# Patient Record
Sex: Female | Born: 1995 | Race: Black or African American | Hispanic: No | Marital: Single | Smoking: Never smoker
Health system: Southern US, Community
[De-identification: ages and names within clinical notes are randomized; demographics above are authoritative.]

## PROBLEM LIST (undated history)

## (undated) ENCOUNTER — Inpatient Hospital Stay (HOSPITAL_COMMUNITY): Payer: Self-pay

## (undated) DIAGNOSIS — F419 Anxiety disorder, unspecified: Secondary | ICD-10-CM

## (undated) DIAGNOSIS — A549 Gonococcal infection, unspecified: Secondary | ICD-10-CM

## (undated) DIAGNOSIS — J45909 Unspecified asthma, uncomplicated: Secondary | ICD-10-CM

## (undated) DIAGNOSIS — M419 Scoliosis, unspecified: Secondary | ICD-10-CM

## (undated) DIAGNOSIS — D649 Anemia, unspecified: Secondary | ICD-10-CM

## (undated) DIAGNOSIS — A749 Chlamydial infection, unspecified: Secondary | ICD-10-CM

## (undated) HISTORY — DX: Anxiety disorder, unspecified: F41.9

## (undated) HISTORY — PX: NOSE SURGERY: SHX723

## (undated) HISTORY — PX: TONSILLECTOMY: SUR1361

## (undated) HISTORY — DX: Anemia, unspecified: D64.9

---
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
## 2017-01-07 ENCOUNTER — Encounter (HOSPITAL_COMMUNITY): Payer: Self-pay

## 2017-01-07 ENCOUNTER — Inpatient Hospital Stay (HOSPITAL_COMMUNITY)
Admission: AD | Admit: 2017-01-07 | Discharge: 2017-01-07 | Disposition: A | Discharge status: Home / Self Care | Payer: Medicaid Other | Source: Ambulatory Visit | Attending: Obstetrics & Gynecology | Admitting: Obstetrics & Gynecology

## 2017-01-07 DIAGNOSIS — O479 False labor, unspecified: Secondary | ICD-10-CM

## 2017-01-07 DIAGNOSIS — Z3A33 33 weeks gestation of pregnancy: Secondary | ICD-10-CM | POA: Insufficient documentation

## 2017-01-07 DIAGNOSIS — O4700 False labor before 37 completed weeks of gestation, unspecified trimester: Secondary | ICD-10-CM

## 2017-01-07 DIAGNOSIS — R109 Unspecified abdominal pain: Secondary | ICD-10-CM | POA: Diagnosis present

## 2017-01-07 LAB — URINALYSIS, ROUTINE W REFLEX MICROSCOPIC
BILIRUBIN URINE: NEGATIVE
Glucose, UA: NEGATIVE mg/dL
HGB URINE DIPSTICK: NEGATIVE
KETONES UR: NEGATIVE mg/dL
NITRITE: NEGATIVE
PROTEIN: NEGATIVE mg/dL
Specific Gravity, Urine: 1.02 (ref 1.005–1.030)
pH: 7 (ref 5.0–8.0)

## 2017-01-07 LAB — WET PREP, GENITAL
Clue Cells Wet Prep HPF POC: NONE SEEN
Sperm: NONE SEEN
Trich, Wet Prep: NONE SEEN
YEAST WET PREP: NONE SEEN

## 2017-01-07 LAB — FETAL FIBRONECTIN: Fetal Fibronectin: NEGATIVE

## 2017-01-07 MED ORDER — NIFEDIPINE 10 MG PO CAPS
10.0000 mg | ORAL_CAPSULE | ORAL | Status: DC | PRN
  Administered 2017-01-07: 10 mg via ORAL
  Filled 2017-01-07: qty 1

## 2017-01-07 NOTE — MAU Provider Note (Signed)
History     CSN: [id number]  Arrival date and time: 01/07/17 1012   First Provider Initiated Contact with Patient 01/07/17 1035      Chief Complaint  Patient presents with  . Abdominal Pain   Patient is a 21 year old G1 P0 at 33 weeks and 3 days who presents to the [facility] via EMS for contractions that started this morning when she woke up. She reports intermittent pressure happening about every 5 minutes. She denies any sex or anything in the vagina and the past 24 hours. She states she's had no vaginal bleeding. She reports regular fetal movement. The contractions are not sharp nor do they feel like tightening in the belly they just feel like increases and decreases in pressure. She has had no problems with this pregnancy so far. She denies any vaginal discharge.    OB History    Gravida Para Term Preterm AB Living   1             SAB TAB Ectopic Multiple Live Births                  Past Medical History:  Diagnosis Date  . Asthma   . Scoliosis     Past Surgical History:  Procedure Laterality Date  . NOSE SURGERY    . TONSILLECTOMY      History reviewed. No pertinent family history.  Social History  Substance Use Topics  . Smoking status: Never Smoker  . Smokeless tobacco: Never Used  . Alcohol use No    Allergies: No Known Allergies  Prescriptions Prior to Admission  Medication Sig Dispense Refill Last Dose  . Prenatal Vit-Fe Fumarate-FA (PRENATAL MULTIVITAMIN) TABS tablet Take 1 tablet by mouth daily at 12 noon.   01/06/2017 at Unknown time    Review of Systems  Constitutional: Negative for chills and fever.  HENT: Negative for congestion and rhinorrhea.   Respiratory: Negative for apnea and shortness of breath.   Cardiovascular: Negative for chest pain and palpitations.  Gastrointestinal: Negative for abdominal distention, constipation, diarrhea, nausea and vomiting.  Genitourinary: Negative for difficulty urinating, dysuria and frequency.  Neurological:  Negative for dizziness and weakness.   Physical Exam   Blood pressure 112/62, pulse 83, temperature 97.6 F (36.4 C), resp. rate 18, last menstrual period 05/18/2016.  Physical Exam  Vitals reviewed. Constitutional: She is oriented to person, place, and time. She appears well-developed and well-nourished.  Cardiovascular: Normal rate, regular rhythm and normal heart sounds.   Respiratory: Effort normal and breath sounds normal. No respiratory distress.  GI: Soft. Bowel sounds are normal. She exhibits no distension. There is no tenderness. There is no rebound and no guarding.  Genitourinary:  Genitourinary Comments: Significant whitish thick vaginal discharge, healthy pink vaginal mucosa, cervix mildly irritated, on digital exam is 1/50 and -2  Neurological: She is alert and oriented to person, place, and time.  Skin: Skin is warm and dry.  Psychiatric: She has a normal mood and affect. Her behavior is normal.    MAU Course  Procedures  MDM In the MA U patient underwent the following interventions -Urinalysis which although grossly contaminated was suspicious for UTI, sent for culture -Fetal Fibronectin which was negative -Wet prep which was negative -Gonorrhea and chlamydia which is pending -NST was performed and revealed a approximately 150s moderate variability reactive for gestational age mild uterine irritability was noted but no distinct contractions. -Cervix was checked and was found to be 1/50 and -2 given that we  are picking up contractions on the monitor and she reported improved pain with Procardia cervix was not rechecked.  She did receive 10 mg of by mouth Procardia with significant improvement in her symptoms. Patient is now comfortable and ready to be discharged home.  Assessment and Plan  Preterm contractions: Improved with Procardia no identifiable cause at this time will continue to monitor follow-up in the office as scheduled. Given precautions to return if  contractions returned. Continue by mouth hydration. Pending results.  Ernestina Penna 01/07/2017, 10:47 AM

## 2017-01-07 NOTE — MAU Note (Signed)
Arrived by EMS. Pt having abdominal pain that started an hour ago. No bleeding or LOF.

## 2017-01-07 NOTE — Discharge Instructions (Signed)
Preventing Preterm Birth °Preterm birth is when your baby is delivered between 20 weeks and 37 weeks of pregnancy. A full-term pregnancy lasts for at least 37 weeks. Preterm birth can be dangerous for your baby because the last few weeks of pregnancy are an important time for your baby's brain and lungs to grow. Many things can cause a baby to be born early. Sometimes the cause is not known. There are certain factors that make you more likely to experience preterm birth, such as: °· Having a previous baby born preterm. °· Being pregnant with twins or other multiples. °· Having had fertility treatment. °· Being overweight or underweight at the start of your pregnancy. °· Having any of the following during pregnancy: °¨ An infection, including a urinary tract infection (UTI) or an STI (sexually transmitted infection). °¨ High blood pressure. °¨ Diabetes. °¨ Vaginal bleeding. °· Being age 35 or older. °· Being age 18 or younger. °· Getting pregnant within 6 months of a previous pregnancy. °· Suffering extreme stress or physical or emotional abuse during pregnancy. °· Standing for long periods of time during pregnancy, such as working at a job that requires standing. °What are the risks? °The most serious risk of preterm birth is that the baby may not survive. This is more likely to happen if a baby is born before 34 weeks. Other risks and complications of preterm birth may include your baby having: °· Breathing problems. °· Brain damage that affects movement and coordination (cerebral palsy). °· Feeding difficulties. °· Vision or hearing problems. °· Infections or inflammation of the digestive tract (colitis). °· Developmental delays. °· Learning disabilities. °· Higher risk for diabetes, heart disease, and high blood pressure later in life. °What can I do to lower my risk? °Medical care °The most important thing you can do to lower your risk for preterm birth is to get routine medical care during pregnancy (prenatal  care). If you have a high risk of preterm birth, you may be referred to a health care provider who specializes in managing high-risk pregnancies (perinatologist). You may be given medicine to help prevent preterm birth. °Lifestyle changes °Certain lifestyle changes can also lower your risk of preterm birth: °· Wait at least 6 months after a pregnancy to become pregnant again. °· Try to plan pregnancy for when you are between 19 and 35 years old. °· Get to a healthy weight before getting pregnant. If you are overweight, work with your health care provider to safely lose weight. °· Do not use any products that contain nicotine or tobacco, such as cigarettes and e-cigarettes. If you need help quitting, ask your health care provider. °· Do not drink alcohol. °· Do not use drugs. °Where to find support: °For more support, consider: °· Talking with your health care provider. °· Talking with a therapist or substance abuse counselor, if you need help quitting. °· Working with a diet and nutrition specialist (dietitian) or a personal trainer to maintain a healthy weight. °· Joining a support group. °Where to find more information: °Learn more about preventing preterm birth from: °· Centers for Disease Control and Prevention: cdc.gov/reproductivehealth/maternalinfanthealth/pretermbirth.htm °· March of Dimes: marchofdimes.org/complications/premature-babies.aspx °· American Pregnancy Association: americanpregnancy.org/labor-and-birth/premature-labor °Contact a health care provider if: °· You have any of the following signs of preterm labor before 37 weeks: °¨ A change or increase in vaginal discharge. °¨ Fluid leaking from your vagina. °¨ Pressure or cramps in your lower abdomen. °¨ A backache that does not go away or gets worse. °¨   Regular tightening (contractions) in your lower abdomen. °Summary °· Preterm birth means having your baby during weeks 20-37 of pregnancy. °· Preterm birth may put your baby at risk for physical and  mental problems. °· Getting good prenatal care can help prevent preterm birth. °· You can lower your risk of preterm birth by making certain lifestyle changes, such as not smoking and not using alcohol. °This information is not intended to replace advice given to you by your health care provider. Make sure you discuss any questions you have with your health care provider. °Document Released: 12/24/2015 Document Revised: 07/18/2016 Document Reviewed: 07/18/2016 °Elsevier Interactive Patient Education © 2017 Elsevier Inc. ° °

## 2017-01-08 LAB — CULTURE, OB URINE

## 2017-01-20 LAB — OB RESULTS CONSOLE GBS: STREP GROUP B AG: NEGATIVE

## 2017-02-19 ENCOUNTER — Inpatient Hospital Stay (HOSPITAL_COMMUNITY)
Admission: AD | Admit: 2017-02-19 | Discharge: 2017-02-21 | DRG: 775 | Disposition: A | Discharge status: Home / Self Care | Payer: Medicaid Other | Source: Ambulatory Visit | Attending: Obstetrics & Gynecology | Admitting: Obstetrics & Gynecology

## 2017-02-19 ENCOUNTER — Encounter (HOSPITAL_COMMUNITY): Payer: Self-pay

## 2017-02-19 DIAGNOSIS — Z3493 Encounter for supervision of normal pregnancy, unspecified, third trimester: Secondary | ICD-10-CM | POA: Diagnosis present

## 2017-02-19 DIAGNOSIS — J45909 Unspecified asthma, uncomplicated: Secondary | ICD-10-CM | POA: Diagnosis present

## 2017-02-19 DIAGNOSIS — M419 Scoliosis, unspecified: Secondary | ICD-10-CM | POA: Diagnosis present

## 2017-02-19 DIAGNOSIS — Z3A39 39 weeks gestation of pregnancy: Secondary | ICD-10-CM | POA: Diagnosis not present

## 2017-02-19 DIAGNOSIS — O9952 Diseases of the respiratory system complicating childbirth: Principal | ICD-10-CM | POA: Diagnosis present

## 2017-02-19 LAB — ABO/RH: ABO/RH(D): A POS

## 2017-02-19 LAB — CBC
HCT: 35.3 % — ABNORMAL LOW (ref 36.0–46.0)
HEMOGLOBIN: 11.6 g/dL — AB (ref 12.0–15.0)
MCH: 28.2 pg (ref 26.0–34.0)
MCHC: 32.9 g/dL (ref 30.0–36.0)
MCV: 85.7 fL (ref 78.0–100.0)
PLATELETS: 233 10*3/uL (ref 150–400)
RBC: 4.12 MIL/uL (ref 3.87–5.11)
RDW: 14.8 % (ref 11.5–15.5)
WBC: 11.9 10*3/uL — ABNORMAL HIGH (ref 4.0–10.5)

## 2017-02-19 LAB — RPR: RPR Ser Ql: NONREACTIVE

## 2017-02-19 LAB — TYPE AND SCREEN
ABO/RH(D): A POS
ANTIBODY SCREEN: NEGATIVE

## 2017-02-19 MED ORDER — ONDANSETRON HCL 4 MG/2ML IJ SOLN: 4.0000 mg | INTRAMUSCULAR | Status: DC | PRN

## 2017-02-19 MED ORDER — OXYTOCIN 10 UNIT/ML IJ SOLN
INTRAMUSCULAR | Status: AC
  Administered 2017-02-19: 10 [IU]
  Filled 2017-02-19: qty 1

## 2017-02-19 MED ORDER — ONDANSETRON HCL 4 MG/2ML IJ SOLN: 4.0000 mg | Freq: Four times a day (QID) | INTRAMUSCULAR | Status: DC | PRN

## 2017-02-19 MED ORDER — WITCH HAZEL-GLYCERIN EX PADS: 1.0000 "application " | MEDICATED_PAD | CUTANEOUS | Status: DC | PRN

## 2017-02-19 MED ORDER — ACETAMINOPHEN 325 MG PO TABS: 650.0000 mg | ORAL_TABLET | ORAL | Status: DC | PRN

## 2017-02-19 MED ORDER — TETANUS-DIPHTH-ACELL PERTUSSIS 5-2.5-18.5 LF-MCG/0.5 IM SUSP: 0.5000 mL | Freq: Once | INTRAMUSCULAR | Status: DC

## 2017-02-19 MED ORDER — LIDOCAINE HCL (PF) 1 % IJ SOLN: 30.0000 mL | INTRAMUSCULAR | Status: DC | PRN

## 2017-02-19 MED ORDER — OXYCODONE HCL 5 MG PO TABS: 5.0000 mg | ORAL_TABLET | ORAL | Status: DC | PRN

## 2017-02-19 MED ORDER — LIDOCAINE HCL (PF) 1 % IJ SOLN
INTRAMUSCULAR | Status: AC
  Filled 2017-02-19: qty 30

## 2017-02-19 MED ORDER — FENTANYL CITRATE (PF) 100 MCG/2ML IJ SOLN: 50.0000 ug | INTRAMUSCULAR | Status: DC | PRN

## 2017-02-19 MED ORDER — METHYLERGONOVINE MALEATE 0.2 MG PO TABS: 0.2000 mg | ORAL_TABLET | ORAL | Status: DC | PRN

## 2017-02-19 MED ORDER — OXYTOCIN BOLUS FROM INFUSION: 500.0000 mL | Freq: Once | INTRAVENOUS | Status: DC

## 2017-02-19 MED ORDER — ZOLPIDEM TARTRATE 5 MG PO TABS: 5.0000 mg | ORAL_TABLET | Freq: Every evening | ORAL | Status: DC | PRN

## 2017-02-19 MED ORDER — BISACODYL 10 MG RE SUPP: 10.0000 mg | Freq: Every day | RECTAL | Status: DC | PRN

## 2017-02-19 MED ORDER — COCONUT OIL OIL: 1.0000 "application " | TOPICAL_OIL | Status: DC | PRN

## 2017-02-19 MED ORDER — LACTATED RINGERS IV SOLN: INTRAVENOUS | Status: DC

## 2017-02-19 MED ORDER — OXYCODONE HCL 5 MG PO TABS: 10.0000 mg | ORAL_TABLET | ORAL | Status: DC | PRN

## 2017-02-19 MED ORDER — BENZOCAINE-MENTHOL 20-0.5 % EX AERO: 1.0000 "application " | INHALATION_SPRAY | CUTANEOUS | Status: DC | PRN

## 2017-02-19 MED ORDER — OXYTOCIN 40 UNITS IN LACTATED RINGERS INFUSION - SIMPLE MED: 2.5000 [IU]/h | INTRAVENOUS | Status: DC

## 2017-02-19 MED ORDER — FERROUS SULFATE 325 (65 FE) MG PO TABS
325.0000 mg | ORAL_TABLET | Freq: Two times a day (BID) | ORAL | Status: DC
  Administered 2017-02-19 – 2017-02-21 (×5): 325 mg via ORAL
  Filled 2017-02-19 (×5): qty 1

## 2017-02-19 MED ORDER — OXYCODONE-ACETAMINOPHEN 5-325 MG PO TABS: 1.0000 | ORAL_TABLET | ORAL | Status: DC | PRN

## 2017-02-19 MED ORDER — METHYLERGONOVINE MALEATE 0.2 MG/ML IJ SOLN: 0.2000 mg | INTRAMUSCULAR | Status: DC | PRN

## 2017-02-19 MED ORDER — MEASLES, MUMPS & RUBELLA VAC ~~LOC~~ INJ: 0.5000 mL | INJECTION | Freq: Once | SUBCUTANEOUS | Status: DC

## 2017-02-19 MED ORDER — ONDANSETRON HCL 4 MG PO TABS
4.0000 mg | ORAL_TABLET | ORAL | Status: DC | PRN
Start: 2017-02-19 — End: 2017-02-21

## 2017-02-19 MED ORDER — DIBUCAINE 1 % RE OINT
1.0000 | TOPICAL_OINTMENT | RECTAL | Status: DC | PRN
Start: 2017-02-19 — End: 2017-02-21

## 2017-02-19 MED ORDER — DOCUSATE SODIUM 100 MG PO CAPS: 100.0000 mg | ORAL_CAPSULE | Freq: Two times a day (BID) | ORAL | Status: DC

## 2017-02-19 MED ORDER — FLEET ENEMA 7-19 GM/118ML RE ENEM: 1.0000 | ENEMA | Freq: Every day | RECTAL | Status: DC | PRN

## 2017-02-19 MED ORDER — PRENATAL MULTIVITAMIN CH
1.0000 | ORAL_TABLET | Freq: Every day | ORAL | Status: DC
  Administered 2017-02-20 – 2017-02-21 (×2): 1 via ORAL
  Filled 2017-02-19 (×2): qty 1

## 2017-02-19 MED ORDER — SIMETHICONE 80 MG PO CHEW
80.0000 mg | CHEWABLE_TABLET | ORAL | Status: DC | PRN
Start: 2017-02-19 — End: 2017-02-21

## 2017-02-19 MED ORDER — DOCUSATE SODIUM 100 MG PO CAPS
100.0000 mg | ORAL_CAPSULE | Freq: Two times a day (BID) | ORAL | Status: DC
  Administered 2017-02-19 – 2017-02-21 (×4): 100 mg via ORAL
  Filled 2017-02-19 (×4): qty 1

## 2017-02-19 MED ORDER — FLEET ENEMA 7-19 GM/118ML RE ENEM: 1.0000 | ENEMA | RECTAL | Status: DC | PRN

## 2017-02-19 MED ORDER — DIPHENHYDRAMINE HCL 25 MG PO CAPS: 25.0000 mg | ORAL_CAPSULE | Freq: Four times a day (QID) | ORAL | Status: DC | PRN

## 2017-02-19 MED ORDER — SOD CITRATE-CITRIC ACID 500-334 MG/5ML PO SOLN: 30.0000 mL | ORAL | Status: DC | PRN

## 2017-02-19 MED ORDER — IBUPROFEN 600 MG PO TABS
600.0000 mg | ORAL_TABLET | Freq: Four times a day (QID) | ORAL | Status: DC
  Administered 2017-02-19 – 2017-02-21 (×11): 600 mg via ORAL
  Filled 2017-02-19 (×11): qty 1

## 2017-02-19 MED ORDER — LACTATED RINGERS IV SOLN: 500.0000 mL | INTRAVENOUS | Status: DC | PRN

## 2017-02-19 MED ORDER — OXYCODONE-ACETAMINOPHEN 5-325 MG PO TABS: 2.0000 | ORAL_TABLET | ORAL | Status: DC | PRN

## 2017-02-19 NOTE — H&P (Signed)
  Makayla White is a 21 y.o. female G1P0 with IUP at [redacted]w[redacted]d presenting for contractions. She arrived via EMS c//c/+2t.   [facility]are at [facility] since 12 wks  Prenatal History/Complications:  Chl/GC 1st timester, + CHL 2/28 (no TOC but txd_)  Past Medical History: Past Medical History:  Diagnosis Date  . Asthma   . Scoliosis     Past Surgical History: Past Surgical History:  Procedure Laterality Date  . NOSE SURGERY    . TONSILLECTOMY      Obstetrical History: OB History    Gravida Para Term Preterm AB Living   1             SAB TAB Ectopic Multiple Live Births                   Social History: Social History   Social History  . Marital status: Single    Spouse name: N/A  . Number of children: N/A  . Years of education: N/A   Social History Main Topics  . Smoking status: Never Smoker  . Smokeless tobacco: Never Used  . Alcohol use No  . Drug use: No  . Sexual activity: Not on file   Other Topics Concern  . Not on file   Social History Narrative  . No narrative on file    Family History: No family history on file.  Allergies: No Known Allergies  Prescriptions Prior to Admission  Medication Sig Dispense Refill Last Dose  . Prenatal Vit-Fe Fumarate-FA (PRENATAL MULTIVITAMIN) TABS tablet Take 1 tablet by mouth daily at 12 noon.   01/06/2017 at Unknown time        Review of Systems   Constitutional: Negative for fever and chills Eyes: Negative for visual disturbances Respiratory: Negative for shortness of breath, dyspnea Cardiovascular: Negative for chest pain or palpitations  Gastrointestinal: Negative for vomiting, diarrhea and constipation.  POSITIVE for abdominal pain (contractions) Genitourinary: Negative for dysuria and urgency Musculoskeletal: Negative for back pain, joint pain, myalgias  Neurological: Negative for dizziness and headaches      Last menstrual period 05/18/2016. General appearance: alert, cooperative and mild  distress Lungs: clear to auscultation bilaterally Heart: regular rate and rhythm Abdomen: soft, non-tender; bowel sounds normal Extremities: Homans sign is negative, no sign of DVT DTR's 2+ Presentation: cephalic Fetal monitoring  Baseline: 150 bpm, Variability: Good {> 6 bpm), Accelerations: Reactive and Decelerations: Absent Uterine activity  q minutes Dilation: 10 Station: 0 Exam by:: Millner, RN    Prenatal labs: ABO, Rh:  A+ Antibody:  neg Rubella: !Error! RPR:   neg HBsAg:   neg HIV:   neg GBS:   neg 1 hr Glucola 112 Genetic screening  neg Anatomy US normal  Prenatal Transfer Tool  Maternal Diabetes: No Genetic Screening: Normal Maternal Ultrasounds/Referrals: Normal Fetal Ultrasounds or other Referrals:  None Maternal Substance Abuse:  No Significant Maternal Medications:  None Significant Maternal Lab Results: Lab values include: Other: + CHL 1st and 3rd trimester (txz'd but no TOC)     No results found for this or any previous visit (from the past 24 hour(s)).  Assessment: Makayla White is a 21 y.o. G1P0 with an IUP at [redacted]w[redacted]d presenting for active labor  Plan: #Labor: expectant management #Pain:  Per request #FWB Cat 1 #ID: GBS: neg  #MOF:  breast #MOC: pills #Circ: outpt   CRESENZO-DISHMAN,[name] 02/19/2017, 6:51 AM

## 2017-02-19 NOTE — MAU Note (Signed)
Pt. Arrived via EMS, pt. Complaint of "hurting contractions". Pt. Placed on monitor, fetal heart rate 130s, toco applied - contraction noted.  Pt. start crying and wanting to go to BR for bowel movement.  SVE C/C/0 with bloody show.  Provider notified, pt. transferred to L&D via stretcher.

## 2017-02-19 NOTE — Lactation Note (Signed)
This note was copied from a baby's chart. Lactation Consultation Note  Patient Name: Makayla White [id number] Date: 02/19/2017 Reason for consult: Initial assessment   Initial consult with first time mom of 3 hour old infant in [place]. Infant in deep sleep on mom's chest. Mom reports infant fed earlier. RN reports infant had difficulty latching due to flat nipples.  Mom reports + breast changes with pregnancy. Mom with large compressible breasts with flat nipples. Showed mom how to hand express and large gtts colostrum noted. Discussued with mom using breast shells and hand pump to assist with everting nipple before latch. Mom voiced understanding.   BF basics, positioning, colostrum, milk coming to volume, hand expression, NB nutritional needs, and supply and demand reviewed with mom. Mom reports she took a BF class. Feeding log given with instructions for use.  BF Resources handout and LC Brochure given, mom informed of IP/OP Services, BF Support Groups and LC phone #. Enc mom to call out when infant awakens for LC to assist with feeding. Mom voiced understanding.    Maternal Data Formula Feeding for Exclusion: No Has patient been taught Hand Expression?: Yes Does the patient have breastfeeding experience prior to this delivery?: No  Feeding    LATCH Score/Interventions                      Lactation Tools Discussed/Used WIC Program: Yes   Consult Status Consult Status: Follow-up Date: 02/19/17 Follow-up type: In-patient    Silas Flood Jakavion Bilodeau 02/19/2017, 10:31 AM

## 2017-02-19 NOTE — Lactation Note (Signed)
This note was copied from a baby's chart. Lactation Consultation Note  Patient Name: Makayla White [id number] Date: 02/19/2017 Reason for consult: Follow-up assessment;Difficult latch   Follow up consult with mom of 4 hour old infant at mom's request. Attempted to latch infant to left breast, infant not able to sustain latch. Right areola is thick and non compressible left nipple semi compressible. Showed mom how to apply # 20 NS, infant then latched and fed fo 20 minutes. Colostrum was in NS when infant came off. Showed mom how to hand express and colostrum easily expressible, 4 cc colostrum fed to infant via spoon, infant tolerated it well.  Enc mom to feed infant at breast STS 8-12 x in 24 hours at first feeding cues. Enc mom to use # 20 NS as needed. Enc mom to hand express and spoon feed infant EBM. Enc mom to call out for assistance as needed.   Breast shells applies post feeding. Hand pump given with instructions for use to evert nipples and cleaning. Discussed that NS is a barrier and that if mom is still using after 24 hours to begin pumping to protect milk supply. Mom without questions/concerns at this time.    Maternal Data Formula Feeding for Exclusion: No Has patient been taught Hand Expression?: Yes Does the patient have breastfeeding experience prior to this delivery?: No  Feeding Feeding Type: Breast Fed Length of feed: 15 min  LATCH Score/Interventions Latch: Repeated attempts needed to sustain latch, nipple held in mouth throughout feeding, stimulation needed to elicit sucking reflex. Intervention(s): Adjust position;Assist with latch;Breast massage;Breast compression  Audible Swallowing: Spontaneous and intermittent  Type of Nipple: Flat Intervention(s): Hand pump;Shells  Comfort (Breast/Nipple): Soft / non-tender     Hold (Positioning): Assistance needed to correctly position infant at breast and maintain latch. Intervention(s): Breastfeeding basics  reviewed;Support Pillows;Position options;Skin to skin  LATCH Score: 7  Lactation Tools Discussed/Used Tools: Nipple Shields Nipple shield size: 20 WIC Program: Yes   Consult Status Consult Status: Follow-up Date: 02/20/17 Follow-up type: In-patient    Silas Flood Calianna Kim 02/19/2017, 12:15 PM

## 2017-02-20 NOTE — Lactation Note (Signed)
This note was copied from a baby's chart. Lactation Consultation Note  Patient Name: Makayla White [id number] Date: 02/20/2017 Reason for consult: Follow-up assessment Infant is 57 hours old & seen by lactation for follow-up assessment. Baby was asleep in crib when LC entered. Mom reported that she wanted to do formula and breastfeed because BF was difficult. Mom reports that her breasts are starting to feel full. Suggested mom try pumping instead of supplementing with formula since her breasts were filling. Mom agreed. Showed mom how to use & clean DEBP. Mom has WIC so faxed referral in case mom is eligible for a pump upon discharge. Mom wanted to pump while LC in room so set mom up pumping. Mom pumped for ~10 mins and got ~61mL from her left breast and a few drops from her right (the right pump body did not have as good of suction so readjusted and it starting pumping better). Baby started showing feeding cues so suggested mom feed baby. Mom tried cross-cradle hold on left breast with nipple shield and baby latched but was not very deep and did not suckle. Suggested mom try laid-back breastfeeding and baby soon went towards mom's right nipple but was not able to sustain a latch (mom's right nipple is very flat) with or without a nipple shield. Tried to get baby to latch onto left breast since her left nipple was a little better. Baby would not latch with nipple shield but did latch without it and suckled for ~5 mins and then would not relatch. Mom suggested trying football hold on her right breast so mom did so with the nipple shield. Baby latched and suckled for ~5 mins and then came off. Milk was in shield after he came off and mom relatched him. LC left while he was still BF so unsure of total length of feeding. Discussed plan with mom- BF at least 8-12x in 24hrs and then post-pump for 10 mins and give baby back any that she pumps if baby still acting hungry.  Mom reports no questions at this  time. Encouraged mom to ask for help as needed.  Maternal Data    Feeding    LATCH Score/Interventions                      Lactation Tools Discussed/Used Pump Review: Setup, frequency, and cleaning;Milk Storage Initiated by:: Blondell Reveal, RD, IBCLC Date initiated:: 02/20/17   Consult Status Consult Status: Follow-up Date: 02/21/17 Follow-up type: In-patient    Oneal Grout 02/20/2017, 7:14 PM
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
## 2017-02-21 ENCOUNTER — Encounter (HOSPITAL_COMMUNITY): Payer: Self-pay

## 2017-02-21 MED ORDER — IBUPROFEN 600 MG PO TABS: 600.0000 mg | ORAL_TABLET | Freq: Four times a day (QID) | ORAL | 0 refills | Status: DC

## 2017-02-21 MED ORDER — NORETHINDRONE 0.35 MG PO TABS: 1.0000 | ORAL_TABLET | Freq: Every day | ORAL | 11 refills | Status: DC
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
## 2017-02-21 NOTE — Discharge Summary (Signed)
OB Discharge Summary     Patient Name: Makayla White DOB: 12-26-1995 MRN: [id number]  Date of admission: 02/19/2017 Delivering MD: Sharyon Cable   Date of discharge: 02/21/2017  Admitting diagnosis: 39 WEEKS CTX Intrauterine pregnancy: [redacted]w[redacted]d     Secondary diagnosis:  Active Problems:   Normal labor  Additional problems: none     Discharge diagnosis: Term Pregnancy Delivered                                                                                                Post partum procedures:none  Augmentation: none  Complications: None  Hospital course:  Onset of Labor With Vaginal Delivery     21 y.o. yo G1P1001 at [redacted]w[redacted]d was admitted in Active Labor on 02/19/2017. She was 10cm on admission.   Patient had an uncomplicated labor course as follows:  Membrane Rupture Time/Date: 6:46 AM ,02/19/2017   Intrapartum Procedures: Episiotomy: None [1]                                         Lacerations:  None [1]  Patient had a delivery of a Viable infant. 02/19/2017  Information for the patient's newborn:  Ketina, Mars [[id number]]  Delivery Method: Vaginal, Spontaneous Delivery (Filed from Delivery Summary)    Pateint had an uncomplicated postpartum course.  She is ambulating, tolerating a regular diet, passing flatus, and urinating well. Patient is discharged home in stable condition on 02/21/17.   Physical exam  Vitals:   02/19/17 1948 02/20/17 0551 02/20/17 1728 02/21/17 0454  BP: 123/74 116/78 107/61 119/64  Pulse: 79 83 76 72  Resp: Temp: 99.3 F (37.4 C) 98.4 F (36.9 C) 97.7 F (36.5 C) 97.4 F (36.3 C)  TempSrc:   Oral Oral   General: alert, cooperative and no distress Lochia: appropriate Uterine Fundus: firm Incision: Healing well with no significant drainage DVT Evaluation: No evidence of DVT seen on physical exam. Labs: Lab Results  Component Value Date   WBC 11.9 (H) 02/19/2017   HGB 11.6 (L) 02/19/2017   HCT 35.3  (L) 02/19/2017   MCV 85.7 02/19/2017   PLT 233 02/19/2017   No flowsheet data found.  Discharge instruction: per After Visit Summary and "Baby and Me Booklet".  After visit meds:  Allergies as of 02/21/2017   No Known Allergies     Medication List    TAKE these medications   ibuprofen 600 MG tablet Commonly known as:  ADVIL,MOTRIN Take 1 tablet (600 mg total) by mouth every 6 (six) hours.   prenatal multivitamin Tabs tablet Take 1 tablet by mouth daily at 12 noon.       Diet: routine diet  Activity: Advance as tolerated. Pelvic rest for 6 weeks.   Outpatient follow up:6 weeks Follow up Appt:No future appointments. Follow up Visit:No Follow-up on file.  Postpartum contraception: Progesterone only pills  Newborn Data: Live born female  Birth Weight: 6 lb 6.3 oz (2900 g) APGAR: 7, 8  Baby Feeding: Breast Disposition:home with mother   02/21/2017 [name], CNM

## 2017-02-21 NOTE — Lactation Note (Signed)
This note was copied from a baby's chart. Lactation Consultation Note  Patient Name: Makayla White [id number] Date: 02/21/2017 Reason for consult: Follow-up assessment;Infant weight loss (7% weight loss, use of nipple Shield , weight today 5-12.2 oz ) Baby has been changed to a baby Patient. Per MBU RN - and updated LC on feeding challenges  LC visited mom and baby sleeping in bedside crib  Mom resting in bed. Per mom the left breast still feel uncomfortable. LC assessed with mom's permission and noted to be filling in some areas of the breast and engorged in the lateral aspects of the left breast and right. LC provided 3 ice packs for the sides and the top part of the breast and assisted mom to laid  back to ice for 20 mins.  Mom aware the flange for pumping needs to be rechecked.  And feeding with the Nipple Shield she has  been using. ( #20 NS ). LC noted the left areola to be compressible and hand expresses easily, the left to be flat and compresses slightly.  MBU RN aware of the the above plan of care.     Maternal Data Has patient been taught Hand Expression?: Yes  Feeding Feeding Type:  (baby last fed at 1220 EBM from a bottle ) Nipple Type: Slow - flow  LATCH Score/Interventions             Problem noted:  (breast filling top engorged, with nodules , esp left breast )  Intervention(s): Breastfeeding basics reviewed     Lactation Tools Discussed/Used Tools: Shells;Nipple Shields Nipple shield size: 20 Shell Type: Inverted Breast pump type: Double-Electric Breast Pump   Consult Status Consult Status: Follow-up Date: 02/21/17 Follow-up type: In-patient    Makayla White 02/21/2017, 2:21 PM

## 2017-02-21 NOTE — Lactation Note (Signed)
This note was copied from a baby's chart. Lactation Consultation Note  Patient Name: Makayla Lyana Asbill White Date: 02/21/2017 Reason for consult: Follow-up assessment   Follow up with mom of 53 hour old infant at RN request. Mom applied # 20 NS to left breast. Mom latched infant to left breast with some assistance. Enc mom to keep infant pulled in close throughout feeding. Infant fed for 10 minutes and then stopped feeding. Mom gave infant bottle of EBM and he took 10 cc and tolerated well.   Right breast mostly soft to touch, left breast with engorgement. Mom to ice breasts for 10-20 minutes and follow with pumping.   Mom reports she would prefer to pump and bottle feed, discussed that is mom's choice if she wishes to do so. Praised mom for her efforts with BF. Unsure if mom will continue to latch infant to breast. Verlon Setting RN in room and aware of plan and mom 's choice.   Enc mom to continue to feed at breast at least every 3 hours. Follow BF with bottle of EBM. Follow with ice to breast for 15-20 minutes followed by pumping for 15 minutes. Mom voiced understanding. Mom is without assistance from family, enc her to see if someone can come and help her tonight.    Maternal Data Formula Feeding for Exclusion: No Has patient been taught Hand Expression?: Yes Does the patient have breastfeeding experience prior to this delivery?: No  Feeding Feeding Type: Breast Fed Length of feed: 10 min  LATCH Score/Interventions Latch: Grasps breast easily, tongue down, lips flanged, rhythmical sucking. Intervention(s): Skin to skin;Teach feeding cues;Waking techniques Intervention(s): Breast massage;Breast compression  Audible Swallowing: Spontaneous and intermittent Intervention(s): Hand expression;Alternate breast massage;Skin to skin  Type of Nipple: Flat Intervention(s): Hand pump;Shells  Comfort (Breast/Nipple): Engorged, cracked, bleeding, large blisters, severe  discomfort Problem noted: Engorgment Intervention(s): Ice  Problem noted: Mild/Moderate discomfort;Filling Interventions (Filling): Frequent nursing;Double electric pump Interventions (Mild/moderate discomfort): Post-pump  Hold (Positioning): Assistance needed to correctly position infant at breast and maintain latch. Intervention(s): Breastfeeding basics reviewed;Support Pillows;Position options;Skin to skin  LATCH Score: 6  Lactation Tools Discussed/Used Tools: Nipple Shields Nipple shield size: 20 Shell Type: Inverted Breast pump type: Double-Electric Breast Pump WIC Program: Yes Pump Review: Setup, frequency, and cleaning;Milk Storage   Consult Status Consult Status: Follow-up Date: 02/22/17 Follow-up type: In-patient    Makayla White 02/21/2017, 5:34 PM

## 2017-02-21 NOTE — Discharge Instructions (Signed)

## 2017-02-21 NOTE — Lactation Note (Signed)
This note was copied from a baby's chart. Lactation Consultation Note  Patient Name: Makayla White [id number] Date: 02/21/2017 Reason for consult: Follow-up assessment;Infant weight loss (7% weight loss, use of nipple Shield , weight today 5-12.2 oz )  2nd LC visit at 1435 , mom had iced for 20 mins both breast.  Prior to mom sitting up , LC with mom's permission tried backward's massage, with some softening, still nodules lateral aspects of both breast and some tightness.  LC assisted her to sit on the side of the bed to make pumping easier.  LC assisted with set up with the DEBP , and per mom had used the #27 flanges.  LC reviewed set up due mom voicing she had been having problems remembering how it all went together.  LC showed mom how to center the pump pieces , the #27 flanges fit well, and LC stayed with mom the 10 mins of pumping, and per mom comfortable.  Baby is due to feed around 1530 so therefore the ice and pumping was to keep the engorgement at bay.  Mom seemed to be very receptive to the [facility] review, and asked appropriate questions.  Per mom has been active with [facility] during her pregnancy , LC faxed DEBP referral form this afternoon for Monday 4/2 for a DEBP .     Maternal Data Has patient been taught Hand Expression?: Yes  Feeding Feeding Type:  (baby last fed at 1220 EBM from a bottle ) Nipple Type: Slow - flow  LATCH Score/Interventions             Problem noted:  (breast filling top engorged, with nodules , esp left breast )  Intervention(s): Breastfeeding basics reviewed     Lactation Tools Discussed/Used Tools: Shells;Nipple Shields Nipple shield size: 20 Shell Type: Inverted Breast pump type: Double-Electric Breast Pump   Consult Status Consult Status: Follow-up Date: 02/21/17 Follow-up type: In-patient    Matilde Sprang Corrina Steffensen 02/21/2017, 3:04 PM

## 2017-02-22 ENCOUNTER — Ambulatory Visit: Payer: Self-pay

## 2017-02-22 LAB — GC/CHLAMYDIA PROBE AMP (~~LOC~~) NOT AT ARMC
CHLAMYDIA, DNA PROBE: NEGATIVE
Neisseria Gonorrhea: NEGATIVE

## 2017-02-22 NOTE — Lactation Note (Signed)
This note was copied from a baby's chart. Lactation Consultation Note  Patient Name: Makayla White Date: 02/22/2017 Reason for consult: Follow-up assessment;Other (Comment) (Engorgement)   Follow up with mom of 75 hour old infant. Mom is pumping and bottle feeding infant due to difficult latch. Mom is still engorged with right more engorged that left. Breasts are very firm and  Mom is able to express larger volumes from the left, the right is expressing small amount. Mom reports she did not use ice last night. Engorgement Treatment for Nursing Mother's handout given and explained to mom. Discussed importance of ice prior to pumping and risk of losing milk supply if breasts are not emptied. Mom voiced understanding.   Discussed DEBP rental with mom through [facility] loaner and encouraged DEBP vs manual. Mom called her mom to ask for $30. I spoke with GM on the phone at request of mom to explain [facility] loaner program. GM is planning to bring in the $30 for rental. [facility] loaner paperwork left in room with instructions for mom to fill out. LC phone # left for mom to call when ready for pump leaner.   Report and POC to Myrene Buddy, NNP and Val, Charity fundraiser.    Maternal Data Formula Feeding for Exclusion: No Has patient been taught Hand Expression?: Yes Does the patient have breastfeeding experience prior to this delivery?: No  Feeding Feeding Type: Breast Fed Nipple Type: Slow - flow Length of feed: 20 min  LATCH Score/Interventions          Comfort (Breast/Nipple): Engorged, cracked, bleeding, large blisters, severe discomfort Problem noted: Engorgment Intervention(s): Ice           Lactation Tools Discussed/Used WIC Program: Yes Pump Review: Setup, frequency, and cleaning;Milk Storage Initiated by:: Reviewed and encouraged   Consult Status Consult Status: Complete Follow-up type: Call as needed    Ed Blalock 02/22/2017, 9:50 AM

## 2017-02-22 NOTE — Lactation Note (Signed)
This note was copied from a baby's chart. Lactation Consultation Note  Patient Name: Makayla White [id number] Date: 02/22/2017 Reason for consult: Follow-up assessment;Other (Comment) (Engorgement) Baby at 77 hr of life and dyad set for D/C today. Collected completed paper work and $30 cash. Folder placed in [facility] box in the lactation office on [address]. Mom is aware of how to use the pump and when/where to return the pump. She will call with any questions.   Maternal Data Formula Feeding for Exclusion: No Has patient been taught Hand Expression?: Yes Does the patient have breastfeeding experience prior to this delivery?: No  Feeding    LATCH Score/Interventions          Comfort (Breast/Nipple): Engorged, cracked, bleeding, large blisters, severe discomfort Problem noted: Engorgment Intervention(s): Ice           Lactation Tools Discussed/Used WIC Program: Yes Pump Review: Setup, frequency, and cleaning;Milk Storage Initiated by:: ES Date initiated:: 02/22/17   Consult Status Consult Status: Complete Follow-up type: Call as needed    Rulon Eisenmenger 02/22/2017, 12:30 PM

## 2017-03-24 NOTE — Congregational Nurse Program (Signed)
Congregational Nurse Program Note  Date of Encounter: 03/24/2017  Past Medical History: Past Medical History:  Diagnosis Date  . Asthma   . Scoliosis     Encounter Details:     CNP Questionnaire - 03/24/17 2353      Patient Demographics   Is this a new or existing patient? New   Patient is considered a/an Not Applicable   Race African-American/Black     Patient Assistance   Location of Patient Assistance Not Applicable   Patient's financial/insurance status Medicaid   Uninsured Patient ([place] Card/Care Connects) No   Patient referred to apply for the following financial assistance Medicaid   Food insecurities addressed Not Applicable   Transportation assistance No   Assistance securing medications No   Educational health offerings Interpersonal relationships;Navigating the healthcare system     Encounter Details   Primary purpose of visit Education/Health Concerns;Spiritual Care/Support Visit   Was an Emergency Department visit averted? Not Applicable   Does patient have a medical provider? Yes   Patient referred to Follow up with established PCP   Was a mental health screening completed? (GAINS tool) No   Does patient have dental issues? No   Does patient have vision issues? No   Does your patient have an abnormal blood pressure today? No   Since previous encounter, have you referred patient for abnormal blood pressure that resulted in a new diagnosis or medication change? No   Does your patient have an abnormal blood glucose today? No   Since previous encounter, have you referred patient for abnormal blood glucose that resulted in a new diagnosis or medication change? No   Was there a life-saving intervention made? No    initial visit  For this  First  Time  other now  Living at  [facility]. States she was living with  Her mother that lost  Her home  and is  now in a shelter also.States she is graduating from [employer] . States  baby's  Father  is  Supportive , baby  boy is 65 month old.  attends  new mothers class at school , states she  enjoys  It,learning  from other mothers. Baby had  A  Rash on its  face  already introducing  Cereal to baby counseled regarding  Introducing  Foods to early  ,looked up milestone development and gave mother a copy of things to look for .  Baby has an appointment at [facility]  04-09-17. Mothers post partum  check up t Health dept.  Infant  Feeding about every 3-4 hours  Sleeps  Fairly  Well , admits  Being a mother can be  Stressful  but when her mother helps  out that is good  she  can get some rest . .Cousin keeps  Baby  While she is  In school.. Plans to go to [employer] [place], 2019. Will  Follow  Weekly  To  Make  Sure she has support and  Follows  Through .

## 2017-04-06 NOTE — Congregational Nurse Program (Signed)
Congregational Nurse Program Note  Date of Encounter: 04/06/2017  Past Medical History: Past Medical History:  Diagnosis Date  . Asthma   . Scoliosis     Encounter Details:     CNP Questionnaire - 04/06/17 1748      Patient Demographics   Is this a new or existing patient? Existing   Patient is considered a/an Not Applicable   Race African-American/Black     Patient Assistance   Location of Patient Assistance Not Applicable   Patient's financial/insurance status Medicaid   Uninsured Patient ([place] Card/Care Connects) No   Patient referred to apply for the following financial assistance Medicaid   Food insecurities addressed Not Applicable   Transportation assistance No   Assistance securing medications No   Educational health offerings Nutrition;Other     Encounter Details   Primary purpose of visit Spiritual Care/Support Visit;Education/Health Concerns   Was an Emergency Department visit averted? Not Applicable   Does patient have a medical provider? Yes   Patient referred to Area Agency;Clinic   Was a mental health screening completed? (GAINS tool) No   Does patient have dental issues? No   Does patient have vision issues? No   Does your patient have an abnormal blood pressure today? No   Since previous encounter, have you referred patient for abnormal blood pressure that resulted in a new diagnosis or medication change? No   Does your patient have an abnormal blood glucose today? No   Since previous encounter, have you referred patient for abnormal blood glucose that resulted in a new diagnosis or medication change? No   Was there a life-saving intervention made? No      In briefly  to see nurse states she has her post partum check up tomorrow  And the baby has his  Check up at  [facility]  on Friday . Baby doing well and rash on face much better,no new foods . Marland Kitchen. Encouraged to keep checking in with me weekly or as needed.

## 2017-04-21 NOTE — Congregational Nurse Program (Signed)
Congregational Nurse Program Note  Date of Encounter: 04/20/2017  Past Medical History: Past Medical History:  Diagnosis Date  . Asthma   . Scoliosis     Encounter Details:     CNP Questionnaire - 04/21/17 1514      Patient Demographics   Is this a new or existing patient? Existing   Patient is considered a/an Not Applicable   Race African-American/Black     Patient Assistance   Location of Patient Assistance Not Applicable   Patient's financial/insurance status Medicaid   Uninsured Patient ([place] Card/Care Connects) No   Patient referred to apply for the following financial assistance Not Applicable   Food insecurities addressed Not Applicable   Transportation assistance No   Assistance securing medications No   Educational health offerings Nutrition     Encounter Details   Primary purpose of visit Spiritual Care/Support Visit   Was an Emergency Department visit averted? Not Applicable   Does patient have a medical provider? Yes   Patient referred to Follow up with established PCP   Was a mental health screening completed? (GAINS tool) No   Does patient have dental issues? No   Does patient have vision issues? No   Does your patient have an abnormal blood pressure today? No   Since previous encounter, have you referred patient for abnormal blood pressure that resulted in a new diagnosis or medication change? No   Does your patient have an abnormal blood glucose today? No   Since previous encounter, have you referred patient for abnormal blood glucose that resulted in a new diagnosis or medication change? No   Was there a life-saving intervention made? No    Brief  Visit with  Mom and  Baby . Baby was was crying and mother attempting to console him . Gaining  Weigh states he  Weighs about 11 lbs.Jamal Maes. Tried to  Talk witH Mom more but she was distracted   Due to trying to meet needs of infant . Will talk later but she did check in with nurse.

## 2017-10-26 ENCOUNTER — Encounter (HOSPITAL_COMMUNITY): Payer: Self-pay

## 2017-10-26 ENCOUNTER — Emergency Department (HOSPITAL_COMMUNITY)
Admission: EM | Admit: 2017-10-26 | Discharge: 2017-10-26 | Disposition: A | Discharge status: Home / Self Care | Payer: Medicaid Other | Attending: Emergency Medicine | Admitting: Emergency Medicine

## 2017-10-26 DIAGNOSIS — Z79899 Other long term (current) drug therapy: Secondary | ICD-10-CM | POA: Insufficient documentation

## 2017-10-26 DIAGNOSIS — R112 Nausea with vomiting, unspecified: Secondary | ICD-10-CM | POA: Insufficient documentation

## 2017-10-26 DIAGNOSIS — J45909 Unspecified asthma, uncomplicated: Secondary | ICD-10-CM | POA: Insufficient documentation

## 2017-10-26 LAB — COMPREHENSIVE METABOLIC PANEL
ALK PHOS: 132 U/L — AB (ref 38–126)
ALT: 15 U/L (ref 14–54)
ANION GAP: 7 (ref 5–15)
AST: 22 U/L (ref 15–41)
Albumin: 4.1 g/dL (ref 3.5–5.0)
BUN: 10 mg/dL (ref 6–20)
CHLORIDE: 106 mmol/L (ref 101–111)
CO2: 23 mmol/L (ref 22–32)
Calcium: 9.2 mg/dL (ref 8.9–10.3)
Creatinine, Ser: 0.5 mg/dL (ref 0.44–1.00)
GFR calc non Af Amer: >60 mL/min (ref >60)
Glucose, Bld: 105 mg/dL — ABNORMAL HIGH (ref 65–99)
Potassium: 4.5 mmol/L (ref 3.5–5.1)
SODIUM: 136 mmol/L (ref 135–145)
Total Bilirubin: 0.7 mg/dL (ref 0.3–1.2)
Total Protein: 8.2 g/dL — ABNORMAL HIGH (ref 6.5–8.1)

## 2017-10-26 LAB — I-STAT BETA HCG BLOOD, ED (MC, WL, AP ONLY)

## 2017-10-26 LAB — URINALYSIS, ROUTINE W REFLEX MICROSCOPIC
Bilirubin Urine: NEGATIVE
Glucose, UA: NEGATIVE mg/dL
HGB URINE DIPSTICK: NEGATIVE
Ketones, ur: NEGATIVE mg/dL
Leukocytes, UA: NEGATIVE
NITRITE: NEGATIVE
PROTEIN: NEGATIVE mg/dL
Specific Gravity, Urine: 1.02 (ref 1.005–1.030)
pH: 7.5 (ref 5.0–8.0)

## 2017-10-26 LAB — CBC
HCT: 40 % (ref 36.0–46.0)
Hemoglobin: 12.9 g/dL (ref 12.0–15.0)
MCH: 28.2 pg (ref 26.0–34.0)
MCHC: 32.3 g/dL (ref 30.0–36.0)
MCV: 87.3 fL (ref 78.0–100.0)
PLATELETS: 432 10*3/uL — AB (ref 150–400)
RBC: 4.58 MIL/uL (ref 3.87–5.11)
RDW: 13.5 % (ref 11.5–15.5)
WBC: 13.1 10*3/uL — ABNORMAL HIGH (ref 4.0–10.5)

## 2017-10-26 LAB — LIPASE, BLOOD: Lipase: 21 U/L (ref 11–51)

## 2017-10-26 MED ORDER — ONDANSETRON 4 MG PO TBDP: 4.0000 mg | ORAL_TABLET | Freq: Three times a day (TID) | ORAL | 0 refills | Status: DC | PRN

## 2017-10-26 MED ORDER — ONDANSETRON 4 MG PO TBDP
4.0000 mg | ORAL_TABLET | Freq: Once | ORAL | Status: AC | PRN
  Administered 2017-10-26: 4 mg via ORAL
  Filled 2017-10-26: qty 1

## 2017-10-26 NOTE — ED Notes (Addendum)
Pt. Tolerate fluid/PO challenge.  

## 2017-10-26 NOTE — ED Notes (Signed)
Gave pt. fluid and crackers.

## 2017-10-26 NOTE — ED Provider Notes (Signed)
[facility]-EMERGENCY DEPT Provider Note   CSN: [id number] Arrival date & time: 10/26/17  1101     History   Chief Complaint Chief Complaint  Patient presents with  . Nausea  . Emesis  . Abdominal Pain    HPI Makayla White is a [age] y.o. female.  HPI Patient presents with nausea and vomiting.  Began around 5 this morning.  No real diarrhea.  States that during vomiting she gets pain in her upper abdomen.  No fevers.  Pain is dull.  Patient is waited around 5-1/2 hours to see me and had Zofran when she was triaged.  States the nausea improved.  No dysuria.  States she has been a heavy marijuana user but has been off it for about a week.  Denies sick contacts.  No blood in the emesis. Past Medical History:  Diagnosis Date  . Asthma   . Scoliosis     Patient Active Problem List   Diagnosis Date Noted  . Normal labor 02/19/2017    Past Surgical History:  Procedure Laterality Date  . NOSE SURGERY    . TONSILLECTOMY      OB History    Gravida Para Term Preterm AB Living   1 1 1     1    SAB TAB Ectopic Multiple Live Births         0 1       Home Medications    Prior to Admission medications   Medication Sig Start Date End Date Taking? Authorizing Provider  acetaminophen (TYLENOL) 500 MG tablet Take 1,000 mg by mouth every 6 (six) hours as needed for moderate pain.   Yes [provider]  ibuprofen (ADVIL,MOTRIN) 600 MG tablet Take 1 tablet (600 mg total) by mouth every 6 (six) hours. Patient not taking: Reported on 10/26/2017 02/21/17   Aviva SignsWilliams, Marie L, CNM  norethindrone (MICRONOR,CAMILA,ERRIN) 0.35 MG tablet Take 1 tablet (0.35 mg total) by mouth daily. Patient not taking: Reported on 10/26/2017 02/21/17   Aviva SignsWilliams, Marie L, CNM  ondansetron (ZOFRAN-ODT) 4 MG disintegrating tablet Take 1 tablet (4 mg total) by mouth every 8 (eight) hours as needed for nausea or vomiting. 10/26/17   Benjiman CorePickering, Kaiyden Simkin, MD  Prenatal Vit-Fe Fumarate-FA  (PRENATAL MULTIVITAMIN) TABS tablet Take 1 tablet by mouth daily at 12 noon.    [provider]    Family History History reviewed. No pertinent family history.  Social History Social History   Tobacco Use  . Smoking status: Never Smoker  . Smokeless tobacco: Never Used  Substance Use Topics  . Alcohol use: No  . Drug use: No     Allergies   Patient has no known allergies.   Review of Systems Review of Systems  Constitutional: Negative for appetite change.  HENT: Negative for congestion and voice change.   Respiratory: Negative for shortness of breath.   Cardiovascular: Negative for chest pain.  Gastrointestinal: Positive for nausea and vomiting. Negative for abdominal pain.  Endocrine: Negative for polyuria.  Genitourinary: Negative for decreased urine volume, enuresis, flank pain, vaginal bleeding and vaginal discharge.  Musculoskeletal: Negative for back pain.  Neurological: Negative for syncope.  Psychiatric/Behavioral: Negative for confusion.     Physical Exam Updated Vital Signs BP 114/69   Pulse 84   Temp 97.7 F (36.5 C) (Oral)   Resp 16   Ht 5\' 5"  (1.651 m)   Wt 62.6 kg (138 lb)   LMP 10/10/2017   SpO2 100%   BMI 22.96 kg/m  Physical Exam  Constitutional: She appears well-developed.  HENT:  Head: Atraumatic.  Patient has braces  Eyes: Pupils are equal, round, and reactive to light.  Cardiovascular: Normal rate.  Pulmonary/Chest: Breath sounds normal.  Abdominal: Normal appearance and bowel sounds are normal. There is no tenderness.  Neurological: She is alert.  Skin: Skin is warm. Capillary refill takes less than 2 seconds.  Psychiatric: She has a normal mood and affect.     ED Treatments / Results  Labs (all labs ordered are listed, but only abnormal results are displayed) Labs Reviewed  COMPREHENSIVE METABOLIC PANEL - Abnormal; Notable for the following components:      Result Value   Glucose, Bld 105 (*)    Total Protein  8.2 (*)    Alkaline Phosphatase 132 (*)    All other components within normal limits  CBC - Abnormal; Notable for the following components:   WBC 13.1 (*)    Platelets 432 (*)    All other components within normal limits  LIPASE, BLOOD  URINALYSIS, ROUTINE W REFLEX MICROSCOPIC  I-STAT BETA HCG BLOOD, ED ([facility], [facility], [facility] ONLY)    EKG  EKG Interpretation None       Radiology No results found.  Procedures Procedures (including critical care time)  Medications Ordered in ED Medications  ondansetron (ZOFRAN-ODT) disintegrating tablet 4 mg (4 mg Oral Given 10/26/17 1152)     Initial Impression / Assessment and Plan / ED Course  I have reviewed the triage vital signs and the nursing notes.  Pertinent labs & imaging results that were available during my care of the patient were reviewed by me and considered in my medical decision making (see chart for details).    Patient with abdominal pain with nausea and vomiting.  Feels better after Zofran.  Labs reassuring white count.  Benign exam.  Tolerate orals will discharge home.  Final Clinical Impressions(s) / ED Diagnoses   Final diagnoses:  Non-intractable vomiting with nausea, unspecified vomiting type    ED Discharge Orders        Ordered    ondansetron (ZOFRAN-ODT) 4 MG disintegrating tablet  Every 8 hours PRN     10/26/17 1739       Benjiman CorePickering, Zyren Sevigny, MD 10/26/17 1743

## 2017-10-26 NOTE — ED Triage Notes (Signed)
Pt arrived via EMS and reports that she has been having n/v since 5 am and reports vomiting 6 times today. Pt denies diarrhea. Pt reports abdominal pain 7/10 aches that worsens during vomiting.

## 2017-10-26 NOTE — ED Notes (Signed)
Zofran given for nausea. Pt is no longer lactating.

## 2017-11-23 NOTE — L&D Delivery Note (Addendum)
Delivery Note Arrived via EMS s/p delivery in ambulance this morning.  States labor started about 4am and progressed quickly.  [facility] (done by Dr Fara BorosAsh) and evaluation.  Baby being evaluated by RN and NICU team will see due to IUGR.  At 5:52 AM a viable and healthy female was delivered via Vaginal, Spontaneous (Presentation: ;  ).  APGAR: , ; weight 4 lb 5 oz (1956 g).   Placenta status: spontaneous and grossly intact with 3 vessel cord.  Purple top of cord blood obtained, unable to get enough for red top.  with the following complications: Precipitous delivery  Anesthesia:  none Episiotomy: None Lacerations: None Suture Repair: none Est. Blood Loss (mL): 200  Mom to postpartum.  Baby to Couplet care / Skin to Skin  NICU team will evaluate for disposition of [facility]  Wynelle BourgeoisMarie Laquonda Welby 08/04/2018, 7:37 AM  Please schedule this patient for Postpartum visit in: 4 weeks with the following provider: Any provider For C/S patients schedule nurse incision check in weeks 2 weeks: no High risk pregnancy complicated by: IUGR Delivery mode:  SVD Anticipated Birth Control:  IUD PP Procedures needed: none  Schedule Integrated BH visit: no

## 2018-01-15 ENCOUNTER — Inpatient Hospital Stay (HOSPITAL_COMMUNITY)
Admission: AD | Admit: 2018-01-15 | Discharge: 2018-01-15 | Disposition: A | Discharge status: Home / Self Care | Payer: Medicaid Other | Source: Ambulatory Visit | Attending: Obstetrics & Gynecology | Admitting: Obstetrics & Gynecology

## 2018-01-15 ENCOUNTER — Encounter (HOSPITAL_COMMUNITY): Payer: Self-pay

## 2018-01-15 ENCOUNTER — Other Ambulatory Visit: Payer: Self-pay

## 2018-01-15 ENCOUNTER — Inpatient Hospital Stay (HOSPITAL_COMMUNITY): Payer: Medicaid Other

## 2018-01-15 DIAGNOSIS — Z3A09 9 weeks gestation of pregnancy: Secondary | ICD-10-CM | POA: Diagnosis not present

## 2018-01-15 DIAGNOSIS — O26891 Other specified pregnancy related conditions, first trimester: Secondary | ICD-10-CM | POA: Insufficient documentation

## 2018-01-15 DIAGNOSIS — R109 Unspecified abdominal pain: Secondary | ICD-10-CM | POA: Diagnosis present

## 2018-01-15 LAB — POCT PREGNANCY, URINE: PREG TEST UR: POSITIVE — AB

## 2018-01-15 LAB — URINALYSIS, ROUTINE W REFLEX MICROSCOPIC
BILIRUBIN URINE: NEGATIVE
Glucose, UA: NEGATIVE mg/dL
Hgb urine dipstick: NEGATIVE
KETONES UR: NEGATIVE mg/dL
Leukocytes, UA: NEGATIVE
NITRITE: NEGATIVE
PROTEIN: NEGATIVE mg/dL
SPECIFIC GRAVITY, URINE: 1.027 (ref 1.005–1.030)
pH: 5 (ref 5.0–8.0)

## 2018-01-15 NOTE — MAU Provider Note (Signed)
History   G2P1001 @ 9.1 wks in with abd pain that started today. Pt concerned that two days ago she was doing situps and now she has pain. Denies any vag bleeding or discharge.  CSN: [id number]  Arrival date & time 01/15/18  1735   None     Chief Complaint  Patient presents with  . Abdominal Pain  . Nausea    HPI  Past Medical History:  Diagnosis Date  . Asthma   . Scoliosis     Past Surgical History:  Procedure Laterality Date  . NOSE SURGERY    . TONSILLECTOMY      History reviewed. No pertinent family history.  Social History   Tobacco Use  . Smoking status: Never Smoker  . Smokeless tobacco: Never Used  Substance Use Topics  . Alcohol use: No  . Drug use: No    OB History    Gravida Para Term Preterm AB Living   2 1 1     1    SAB TAB Ectopic Multiple Live Births         0 1      Review of Systems  Constitutional: Negative.   HENT: Negative.   Eyes: Negative.   Respiratory: Negative.   Cardiovascular: Negative.   Gastrointestinal: Positive for abdominal pain.  Endocrine: Negative.   Genitourinary: Negative.   Musculoskeletal: Negative.   Skin: Negative.   Allergic/Immunologic: Negative.   Neurological: Negative.   Hematological: Negative.   Psychiatric/Behavioral: Negative.     Allergies  Patient has no known allergies.  Home Medications    BP 113/71 (BP Location: Right Arm)   Pulse 100   Temp 99.2 F (37.3 C) (Oral)   Resp 20   Ht 5\' 5"  (1.651 m)   Wt 142 lb 12 oz (64.8 kg)   LMP 11/12/2017 (Exact Date)   SpO2 100%   BMI 23.75 kg/m   Physical Exam  Constitutional: She is oriented to person, place, and time. She appears well-developed and well-nourished.  HENT:  Head: Normocephalic.  Eyes: Pupils are equal, round, and reactive to light.  Neck: Normal range of motion.  Cardiovascular: Normal rate, regular rhythm, normal heart sounds and intact distal pulses.  Pulmonary/Chest: Effort normal and breath sounds normal.   Abdominal: Soft. Bowel sounds are normal. There is tenderness.  Genitourinary: Vagina normal and uterus normal.  Musculoskeletal: Normal range of motion.  Neurological: She is alert and oriented to person, place, and time. She has normal reflexes.  Skin: Skin is warm and dry.  Psychiatric: She has a normal mood and affect. Her behavior is normal. Judgment and thought content normal.    MAU Course  Procedures (including critical care time)  Labs Reviewed  URINALYSIS, ROUTINE W REFLEX MICROSCOPIC - Abnormal; Notable for the following components:      Result Value   APPearance HAZY (*)    All other components within normal limits  POCT PREGNANCY, URINE - Abnormal; Notable for the following components:   Preg Test, Ur POSITIVE (*)    All other components within normal limits   No results found.   1. Abdominal pain in pregnancy, first trimester       MDM  VSS, abd soft sl tenderness noted. No vag bleeding or abnormal discharge. [place] shows viable IUP. U/A normal will d/c home

## 2018-01-15 NOTE — MAU Note (Signed)
Pt presents with c/o constant lower abdominal pain that began this morning.  Denies VB.   Reports began doing sit ups 2 days ago.

## 2018-01-15 NOTE — Discharge Instructions (Signed)

## 2018-02-02 ENCOUNTER — Other Ambulatory Visit (HOSPITAL_COMMUNITY): Payer: Self-pay | Admitting: Nurse Practitioner

## 2018-02-02 DIAGNOSIS — Z369 Encounter for antenatal screening, unspecified: Secondary | ICD-10-CM

## 2018-02-08 ENCOUNTER — Ambulatory Visit (HOSPITAL_COMMUNITY): Payer: Medicaid Other

## 2018-02-08 ENCOUNTER — Ambulatory Visit (HOSPITAL_COMMUNITY)
Admission: RE | Admit: 2018-02-08 | Discharge: 2018-02-08 | Disposition: A | Discharge status: Home / Self Care | Payer: Medicaid Other | Source: Ambulatory Visit | Attending: Nurse Practitioner | Admitting: Nurse Practitioner

## 2018-02-09 ENCOUNTER — Other Ambulatory Visit (HOSPITAL_COMMUNITY): Payer: Self-pay | Admitting: *Deleted

## 2018-02-09 ENCOUNTER — Encounter (HOSPITAL_COMMUNITY): Payer: Self-pay

## 2018-02-09 ENCOUNTER — Other Ambulatory Visit (HOSPITAL_COMMUNITY): Payer: Self-pay | Admitting: Nurse Practitioner

## 2018-02-09 ENCOUNTER — Ambulatory Visit (HOSPITAL_COMMUNITY)
Admission: RE | Admit: 2018-02-09 | Discharge: 2018-02-09 | Disposition: A | Discharge status: Home / Self Care | Payer: Medicaid Other | Source: Ambulatory Visit | Attending: Nurse Practitioner | Admitting: Nurse Practitioner

## 2018-02-09 ENCOUNTER — Ambulatory Visit (HOSPITAL_COMMUNITY): Admission: RE | Admit: 2018-02-09 | Payer: Medicaid Other | Source: Ambulatory Visit

## 2018-02-09 DIAGNOSIS — Z369 Encounter for antenatal screening, unspecified: Secondary | ICD-10-CM

## 2018-02-09 DIAGNOSIS — Z3682 Encounter for antenatal screening for nuchal translucency: Secondary | ICD-10-CM | POA: Diagnosis present

## 2018-02-09 DIAGNOSIS — Z3481 Encounter for supervision of other normal pregnancy, first trimester: Secondary | ICD-10-CM | POA: Diagnosis not present

## 2018-02-09 DIAGNOSIS — Z3689 Encounter for other specified antenatal screening: Secondary | ICD-10-CM | POA: Insufficient documentation

## 2018-02-09 DIAGNOSIS — Z3A12 12 weeks gestation of pregnancy: Secondary | ICD-10-CM | POA: Diagnosis present

## 2018-02-15 ENCOUNTER — Ambulatory Visit (HOSPITAL_COMMUNITY)
Admission: RE | Admit: 2018-02-15 | Discharge: 2018-02-15 | Disposition: A | Discharge status: Home / Self Care | Payer: Medicaid Other | Source: Ambulatory Visit | Attending: Nurse Practitioner | Admitting: Nurse Practitioner

## 2018-02-15 ENCOUNTER — Ambulatory Visit (HOSPITAL_COMMUNITY): Payer: Medicaid Other

## 2018-02-16 ENCOUNTER — Ambulatory Visit (HOSPITAL_COMMUNITY)
Admission: RE | Admit: 2018-02-16 | Discharge: 2018-02-16 | Disposition: A | Discharge status: Home / Self Care | Payer: Medicaid Other | Source: Ambulatory Visit | Attending: Nurse Practitioner | Admitting: Nurse Practitioner

## 2018-02-16 ENCOUNTER — Other Ambulatory Visit (HOSPITAL_COMMUNITY): Payer: Self-pay | Admitting: Obstetrics and Gynecology

## 2018-02-16 ENCOUNTER — Ambulatory Visit (HOSPITAL_COMMUNITY): Admission: RE | Admit: 2018-02-16 | Payer: Medicaid Other | Source: Ambulatory Visit

## 2018-02-16 DIAGNOSIS — Z3682 Encounter for antenatal screening for nuchal translucency: Secondary | ICD-10-CM | POA: Insufficient documentation

## 2018-02-16 DIAGNOSIS — Z369 Encounter for antenatal screening, unspecified: Secondary | ICD-10-CM

## 2018-02-16 DIAGNOSIS — Z3A13 13 weeks gestation of pregnancy: Secondary | ICD-10-CM

## 2018-03-23 ENCOUNTER — Other Ambulatory Visit: Payer: Self-pay | Admitting: Pediatrics

## 2018-03-23 DIAGNOSIS — Z207 Contact with and (suspected) exposure to pediculosis, acariasis and other infestations: Secondary | ICD-10-CM

## 2018-03-23 DIAGNOSIS — Z2089 Contact with and (suspected) exposure to other communicable diseases: Secondary | ICD-10-CM

## 2018-03-23 MED ORDER — PERMETHRIN 5 % EX CREA: 1.0000 "application " | TOPICAL_CREAM | Freq: Once | CUTANEOUS | 3 refills | Status: AC

## 2018-03-23 NOTE — Progress Notes (Signed)
Patients son diagnosed with scabies and prescription sent due to exposure

## 2018-03-28 ENCOUNTER — Encounter (HOSPITAL_COMMUNITY): Payer: Self-pay

## 2018-04-04 ENCOUNTER — Other Ambulatory Visit (HOSPITAL_COMMUNITY): Payer: Self-pay | Admitting: Obstetrics & Gynecology

## 2018-04-04 ENCOUNTER — Other Ambulatory Visit: Payer: Self-pay | Admitting: Obstetrics & Gynecology

## 2018-04-04 DIAGNOSIS — Z3A23 23 weeks gestation of pregnancy: Secondary | ICD-10-CM

## 2018-04-04 DIAGNOSIS — IMO0002 Reserved for concepts with insufficient information to code with codable children: Secondary | ICD-10-CM

## 2018-04-04 DIAGNOSIS — Z0489 Encounter for examination and observation for other specified reasons: Secondary | ICD-10-CM

## 2018-04-10 ENCOUNTER — Encounter (HOSPITAL_COMMUNITY): Payer: Self-pay

## 2018-04-10 ENCOUNTER — Inpatient Hospital Stay (HOSPITAL_COMMUNITY)
Admission: AD | Admit: 2018-04-10 | Discharge: 2018-04-10 | Disposition: A | Discharge status: Home / Self Care | Payer: Medicaid Other | Source: Ambulatory Visit | Attending: Obstetrics & Gynecology | Admitting: Obstetrics & Gynecology

## 2018-04-10 DIAGNOSIS — Z3A21 21 weeks gestation of pregnancy: Secondary | ICD-10-CM | POA: Diagnosis not present

## 2018-04-10 DIAGNOSIS — O98812 Other maternal infectious and parasitic diseases complicating pregnancy, second trimester: Secondary | ICD-10-CM | POA: Diagnosis not present

## 2018-04-10 DIAGNOSIS — A749 Chlamydial infection, unspecified: Secondary | ICD-10-CM

## 2018-04-10 DIAGNOSIS — O99612 Diseases of the digestive system complicating pregnancy, second trimester: Secondary | ICD-10-CM | POA: Insufficient documentation

## 2018-04-10 DIAGNOSIS — K644 Residual hemorrhoidal skin tags: Secondary | ICD-10-CM | POA: Diagnosis not present

## 2018-04-10 DIAGNOSIS — O26892 Other specified pregnancy related conditions, second trimester: Secondary | ICD-10-CM | POA: Diagnosis not present

## 2018-04-10 DIAGNOSIS — O26899 Other specified pregnancy related conditions, unspecified trimester: Secondary | ICD-10-CM

## 2018-04-10 DIAGNOSIS — R1030 Lower abdominal pain, unspecified: Secondary | ICD-10-CM | POA: Diagnosis present

## 2018-04-10 DIAGNOSIS — B3731 Acute candidiasis of vulva and vagina: Secondary | ICD-10-CM

## 2018-04-10 DIAGNOSIS — B373 Candidiasis of vulva and vagina: Secondary | ICD-10-CM | POA: Insufficient documentation

## 2018-04-10 DIAGNOSIS — R102 Pelvic and perineal pain: Secondary | ICD-10-CM | POA: Diagnosis not present

## 2018-04-10 DIAGNOSIS — O98819 Other maternal infectious and parasitic diseases complicating pregnancy, unspecified trimester: Secondary | ICD-10-CM

## 2018-04-10 DIAGNOSIS — R109 Unspecified abdominal pain: Secondary | ICD-10-CM

## 2018-04-10 LAB — WET PREP, GENITAL
Clue Cells Wet Prep HPF POC: NONE SEEN
Sperm: NONE SEEN
Trich, Wet Prep: NONE SEEN

## 2018-04-10 LAB — URINALYSIS, ROUTINE W REFLEX MICROSCOPIC
Bilirubin Urine: NEGATIVE
Glucose, UA: NEGATIVE mg/dL
Hgb urine dipstick: NEGATIVE
Ketones, ur: NEGATIVE mg/dL
Leukocytes, UA: NEGATIVE
Nitrite: NEGATIVE
Protein, ur: NEGATIVE mg/dL
Specific Gravity, Urine: 1.015 (ref 1.005–1.030)
pH: 7 (ref 5.0–8.0)

## 2018-04-10 MED ORDER — HYDROCORTISONE 1 % EX CREA: TOPICAL_CREAM | CUTANEOUS | 0 refills | Status: DC

## 2018-04-10 MED ORDER — PRENATAL GUMMIES/DHA & FA 0.4-32.5 MG PO CHEW: 1.0000 | CHEWABLE_TABLET | Freq: Every day | ORAL | 6 refills | Status: DC

## 2018-04-10 MED ORDER — ACETAMINOPHEN 500 MG PO TABS
1000.0000 mg | ORAL_TABLET | Freq: Once | ORAL | Status: AC
  Administered 2018-04-10: 1000 mg via ORAL
  Filled 2018-04-10: qty 2

## 2018-04-10 MED ORDER — COMFORT FIT MATERNITY SUPP MED MISC: 1.0000 [IU] | Freq: Every day | 0 refills | Status: DC

## 2018-04-10 NOTE — MAU Note (Signed)
Pt presents via EMS with c/o of abdominal pain and blood in stool that started at 4pm today. Pt states the pain is constant stabbing in nature. Pt has not taken anything for pain. 7/10. Pt states she had been straining during BM and there was a lot of bright red blood. Pt states she has not had problems with constipation or hemorrhoids.

## 2018-04-10 NOTE — MAU Provider Note (Signed)
Chief Complaint: Abdominal Pain   First Provider Initiated Contact with Patient 04/10/18 2212      SUBJECTIVE HPI: Makayla White is a 22 y.o. G2P1001 at [redacted]w[redacted]d by LMP who presents to maternity admissions via EMS reporting abdominal pain and blood in stool. She reports abdominal pain  Started today around 1600, she reports the abdominal pain being specific to her lower abdomen and lateral sides. She describes the abdominal pain as sharp stabbing pain that gets worse with movement. Rates pain 7/10- has not taken any medication for abdominal pain. She reports one episode of bloody stools occurred today this afternoon around 1300- she reports straining during her BM where she noticed bright red blood around her stool in the toilet. She denies hx of constipation or hemorrhoids. She denies vaginal bleeding, vaginal itching/burning, urinary symptoms, h/a, dizziness, n/v, or fever/chills.    Past Medical History:  Diagnosis Date  . Asthma   . Scoliosis    Past Surgical History:  Procedure Laterality Date  . NOSE SURGERY    . TONSILLECTOMY     Social History   Socioeconomic History  . Marital status: Single    Spouse name: Not on file  . Number of children: Not on file  . Years of education: Not on file  . Highest education level: Not on file  Occupational History  . Not on file  Social Needs  . Financial resource strain: Not on file  . Food insecurity:    Worry: Not on file    Inability: Not on file  . Transportation needs:    Medical: Not on file    Non-medical: Not on file  Tobacco Use  . Smoking status: Never Smoker  . Smokeless tobacco: Never Used  Substance and Sexual Activity  . Alcohol use: No  . Drug use: No  . Sexual activity: Yes  Lifestyle  . Physical activity:    Days per week: Not on file    Minutes per session: Not on file  . Stress: Not on file  Relationships  . Social connections:    Talks on phone: Not on file    Gets together: Not on file   Attends religious service: Not on file    Active member of club or organization: Not on file    Attends meetings of clubs or organizations: Not on file    Relationship status: Not on file  . Intimate partner violence:    Fear of current or ex partner: Not on file    Emotionally abused: Not on file    Physically abused: Not on file    Forced sexual activity: Not on file  Other Topics Concern  . Not on file  Social History Narrative  . Not on file   No current facility-administered medications on file prior to encounter.    Current Outpatient Medications on File Prior to Encounter  Medication Sig Dispense Refill  . Prenatal Vit-Fe Fumarate-FA (PRENATAL MULTIVITAMIN) TABS tablet Take 1 tablet by mouth daily at 12 noon.    Marland Kitchen acetaminophen (TYLENOL) 500 MG tablet Take 1,000 mg by mouth every 6 (six) hours as needed for moderate pain.    Marland Kitchen loratadine (CLARITIN) 10 MG tablet Take 10 mg by mouth daily.    . ondansetron (ZOFRAN-ODT) 4 MG disintegrating tablet Take 1 tablet (4 mg total) by mouth every 8 (eight) hours as needed for nausea or vomiting. (Patient not taking: Reported on 02/09/2018) 8 tablet 0   No Known Allergies  ROS:  Review of  Systems  Respiratory: Negative.   Cardiovascular: Negative.   Gastrointestinal: Positive for abdominal pain and blood in stool. Negative for constipation, diarrhea, nausea and vomiting.  Genitourinary: Negative.   Musculoskeletal: Negative.   Neurological: Negative.    I have reviewed patient's Past Medical Hx, Surgical Hx, Family Hx, Social Hx, medications and allergies.   Physical Exam   Vitals:   04/10/18 2155 04/10/18 2346  BP: (!) 104/58 (!) 123/99  Pulse: 71 79  Resp: 18 18  Temp: 98 F (36.7 C)   SpO2: 97%   Weight: 145 lb (65.8 kg)   Height:  (1.651 m)    Constitutional: Well-developed, well-nourished female in no acute distress.  Cardiovascular: normal rate Respiratory: normal effort GI: Abd soft, non-tender. Pos BS x 4,  gravid appropriate for gestational age  MS: Extremities nontender, no edema, normal ROM Neurologic: Alert and oriented x 4.  Rectal: 1cm small external hemorrhoid noted- not thrombosed, easily decreased   PELVIC EXAM: blind swabs obtained  Cervical Exam: Dilation: Closed Effacement (%): Thick Cervical Position: Posterior Exam by:: Steward Drone CNM  FHT 152 by doppler  LAB RESULTS Results for orders placed or performed during the hospital encounter of 04/10/18 (from the past 24 hour(s))  Wet prep, genital     Status: Abnormal   Collection Time: 04/10/18 10:29 PM  Result Value Ref Range   Yeast Wet Prep HPF POC PRESENT (A) NONE SEEN   Trich, Wet Prep NONE SEEN NONE SEEN   Clue Cells Wet Prep HPF POC NONE SEEN NONE SEEN   WBC, Wet Prep HPF POC MODERATE (A) NONE SEEN   Sperm NONE SEEN    Urinalysis, Routine w reflex microscopic     Status: None   Collection Time: 04/10/18  9:52 PM  Result Value Ref Range   Color, Urine YELLOW YELLOW   APPearance CLEAR CLEAR   Specific Gravity, Urine 1.015 1.005 - 1.030   pH 7.0 5.0 - 8.0   Glucose, UA NEGATIVE NEGATIVE mg/dL   Hgb urine dipstick NEGATIVE NEGATIVE   Bilirubin Urine NEGATIVE NEGATIVE   Ketones, ur NEGATIVE NEGATIVE mg/dL   Protein, ur NEGATIVE NEGATIVE mg/dL   Nitrite NEGATIVE NEGATIVE   Leukocytes, UA NEGATIVE NEGATIVE    Comment: Microscopic not done on urines with negative protein, blood, leukocytes, nitrite, or glucose < 500 mg/dL. Performed at [facility], [address] [place], [place] [address]     MAU Management/MDM: Orders Placed This Encounter  Procedures  . Wet prep, genital  . Urinalysis, Routine w reflex microscopic   Wet prep- positive for yeast, patient denies clinical symptoms of irritation or burning with urination. Will not treat for yeast infection GC/C- pending UA- negative   Meds ordered this encounter  Medications  . acetaminophen (TYLENOL) tablet 1,000 mg  . Elastic Bandages &  Supports (COMFORT FIT MATERNITY SUPP MED) MISC    Sig: 1 Units by Does not apply route daily.    Dispense:  1 each    Refill:  0    Order Specific Question:   Supervising Provider    Answer:   Tilda Burrow [[id number]]  . Prenatal MV-Min-FA-Omega-3 (PRENATAL GUMMIES/DHA & FA) 0.4-32.5 MG CHEW    Sig: Chew 1 tablet by mouth daily.    Dispense:  30 tablet    Refill:  6    Order Specific Question:   Supervising Provider    Answer:   Tilda Burrow [[id number]]  . hydrocortisone cream 1 %    Sig:  Apply to affected area 2 times daily    Dispense:  15 g    Refill:  0    Order Specific Question:   Supervising Provider    Answer:   Tilda Burrow [[id number]]   Treatments in MAU included 1,000mg  Tylenol for abdominal pain, patient reports relief of pain with medication treatment.   Discussed use of pregnancy support belt daily and while at work to decreased the amount of round ligament pain. Rx sent to pharmacy of choice. Patient request prenatal gummy Rx to be sent to that pharmacy as well.   Educated on increasing the amount of water she drinks per day to 6-8 bottles of water. Discussed not to strain during BM as that will cause further hemorrhoids and to apply cream BID relief of pain.    Pt discharged. Pt stable at time of discharge.  ASSESSMENT 1. Pain of round ligament affecting pregnancy, antepartum   2. Abdominal pain during pregnancy in second trimester   3. Vaginal yeast infection   4. External hemorrhoid     PLAN Discharge home Follow up as scheduled at the [facility] for prenatal appointment  Return to MAU as needed for emergencies  Rx for Prenatal vitamins, hemorrhoid cream and maternity support belt sent to pharmacy of choice  Will call patient with results of GC/C if positive  Discussed safe medication in pregnancy and use of Tylenol during pregnancy for pain    Allergies as of 04/10/2018   No Known Allergies     Medication List    STOP taking these medications   prenatal  multivitamin Tabs tablet     TAKE these medications   acetaminophen 500 MG tablet Commonly known as:  TYLENOL Take 1,000 mg by mouth every 6 (six) hours as needed for moderate pain.   COMFORT FIT MATERNITY SUPP MED Misc 1 Units by Does not apply route daily.   hydrocortisone cream 1 % Apply to affected area 2 times daily   loratadine 10 MG tablet Commonly known as:  CLARITIN Take 10 mg by mouth daily.   ondansetron 4 MG disintegrating tablet Commonly known as:  ZOFRAN-ODT Take 1 tablet (4 mg total) by mouth every 8 (eight) hours as needed for nausea or vomiting.   PRENATAL GUMMIES/DHA & FA 0.4-32.5 MG Chew Chew 1 tablet by mouth daily.      Steward Drone  Certified Nurse-Midwife 04/10/2018  11:23 PM

## 2018-04-11 ENCOUNTER — Other Ambulatory Visit: Payer: Self-pay | Admitting: Certified Nurse Midwife

## 2018-04-11 DIAGNOSIS — A749 Chlamydial infection, unspecified: Secondary | ICD-10-CM

## 2018-04-11 DIAGNOSIS — O98819 Other maternal infectious and parasitic diseases complicating pregnancy, unspecified trimester: Principal | ICD-10-CM

## 2018-04-11 LAB — GC/CHLAMYDIA PROBE AMP (~~LOC~~) NOT AT ARMC
Chlamydia: POSITIVE — AB
Neisseria Gonorrhea: NEGATIVE

## 2018-04-11 MED ORDER — AZITHROMYCIN 500 MG PO TABS: 1000.0000 mg | ORAL_TABLET | Freq: Once | ORAL | 0 refills | Status: AC

## 2018-04-11 NOTE — Progress Notes (Signed)
Makayla White tested positive for  Chlamydia. Patient was called by RN and allergies and pharmacy confirmed. Rx sent to pharmacy of choice.   Sharyon Cable, CNM 04/11/2018 8:17 PM

## 2018-04-25 ENCOUNTER — Other Ambulatory Visit (HOSPITAL_COMMUNITY): Payer: Self-pay | Admitting: Obstetrics & Gynecology

## 2018-04-25 ENCOUNTER — Ambulatory Visit (HOSPITAL_COMMUNITY)
Admission: RE | Admit: 2018-04-25 | Discharge: 2018-04-25 | Disposition: A | Discharge status: Home / Self Care | Payer: Medicaid Other | Source: Ambulatory Visit | Attending: Obstetrics & Gynecology | Admitting: Obstetrics & Gynecology

## 2018-04-25 DIAGNOSIS — Z0489 Encounter for examination and observation for other specified reasons: Secondary | ICD-10-CM

## 2018-04-25 DIAGNOSIS — O321XX0 Maternal care for breech presentation, not applicable or unspecified: Secondary | ICD-10-CM | POA: Diagnosis not present

## 2018-04-25 DIAGNOSIS — Z3A23 23 weeks gestation of pregnancy: Secondary | ICD-10-CM

## 2018-04-25 DIAGNOSIS — Z363 Encounter for antenatal screening for malformations: Secondary | ICD-10-CM | POA: Insufficient documentation

## 2018-04-25 DIAGNOSIS — IMO0002 Reserved for concepts with insufficient information to code with codable children: Secondary | ICD-10-CM

## 2018-05-19 ENCOUNTER — Inpatient Hospital Stay (HOSPITAL_COMMUNITY)
Admission: AD | Admit: 2018-05-19 | Discharge: 2018-05-19 | Disposition: A | Discharge status: Home / Self Care | Payer: Medicaid Other | Source: Ambulatory Visit | Attending: Obstetrics and Gynecology | Admitting: Obstetrics and Gynecology

## 2018-05-19 ENCOUNTER — Encounter (HOSPITAL_COMMUNITY): Payer: Self-pay | Admitting: *Deleted

## 2018-05-19 DIAGNOSIS — O368121 Decreased fetal movements, second trimester, fetus 1: Secondary | ICD-10-CM

## 2018-05-19 DIAGNOSIS — O368120 Decreased fetal movements, second trimester, not applicable or unspecified: Secondary | ICD-10-CM | POA: Insufficient documentation

## 2018-05-19 DIAGNOSIS — O99512 Diseases of the respiratory system complicating pregnancy, second trimester: Secondary | ICD-10-CM | POA: Diagnosis not present

## 2018-05-19 DIAGNOSIS — Z79899 Other long term (current) drug therapy: Secondary | ICD-10-CM | POA: Diagnosis not present

## 2018-05-19 DIAGNOSIS — Z3A26 26 weeks gestation of pregnancy: Secondary | ICD-10-CM | POA: Diagnosis not present

## 2018-05-19 DIAGNOSIS — J45909 Unspecified asthma, uncomplicated: Secondary | ICD-10-CM | POA: Diagnosis not present

## 2018-05-19 NOTE — Discharge Instructions (Signed)

## 2018-05-19 NOTE — MAU Provider Note (Signed)
History     CSN: [id number]  Arrival date and time: 05/19/18 1755   First Provider Initiated Contact with Patient 05/19/18 1914      Chief Complaint  Patient presents with  . Decreased Fetal Movement   Makayla White is a 22 y.o. G2P1001 at 678w5d who presents via EMS for decreased fetal movement.  She states she felt no movements since 11 AM today.  Since being in MAU on the monitor she is feeling normal fetal activity.  She reports occasional mild cramping, no abdominal pain otherwise.  No vaginal bleeding or leakage of fluid.  [facility]   OB History  Gravida Para Term Preterm AB Living  2 1 1     1   SAB TAB Ectopic Multiple Live Births        0 1    # Outcome Date GA Lbr Len/2nd Weight Sex Delivery Anes PTL Lv  2 Current           1 Term 02/19/17 3712w4d 02:01 / 00:17 6 lb 6.3 oz (2.9 kg) M Vag-Spont None  LIV     Past Medical History:  Diagnosis Date  . Asthma   . Scoliosis     Past Surgical History:  Procedure Laterality Date  . NOSE SURGERY    . TONSILLECTOMY      History reviewed. No pertinent family history.  Social History   Tobacco Use  . Smoking status: Never Smoker  . Smokeless tobacco: Never Used  Substance Use Topics  . Alcohol use: No  . Drug use: No    Allergies: No Known Allergies  Medications Prior to Admission  Medication Sig Dispense Refill Last Dose  . acetaminophen (TYLENOL) 500 MG tablet Take 1,000 mg by mouth every 6 (six) hours as needed for moderate pain.   Taking  . Elastic Bandages & Supports (COMFORT FIT MATERNITY SUPP MED) MISC 1 Units by Does not apply route daily. 1 each 0   . hydrocortisone cream 1 % Apply to affected area 2 times daily 15 g 0   . loratadine (CLARITIN) 10 MG tablet Take 10 mg by mouth daily.   Taking  . ondansetron (ZOFRAN-ODT) 4 MG disintegrating tablet Take 1 tablet (4 mg total) by mouth every 8 (eight) hours as needed for  nausea or vomiting. (Patient not taking: Reported on 02/09/2018) 8 tablet 0 Not Taking  . Prenatal MV-Min-FA-Omega-3 (PRENATAL GUMMIES/DHA & FA) 0.4-32.5 MG CHEW Chew 1 tablet by mouth daily. 30 tablet 6     Review of Systems  Constitutional: Negative for fatigue and fever.  Gastrointestinal: Negative for nausea and vomiting.  Genitourinary: Negative for dysuria and flank pain.  Musculoskeletal: Negative for back pain.  Neurological: Negative for dizziness and light-headedness.  Psychiatric/Behavioral: The patient is nervous/anxious.    Physical Exam   Blood pressure (!) 112/57, pulse 76, temperature 98.1 F (36.7 C), resp. rate 18, height 5\' 5"  (1.651 m), weight 144 lb (65.3 kg), last menstrual period 11/05/2017, unknown if currently breastfeeding.  Physical Exam  Nursing note and vitals reviewed. Constitutional: She is oriented to person, place, and time. She appears well-developed and well-nourished. No distress.  HENT:  Head: Normocephalic.  Eyes: No scleral icterus.  Neck: Neck supple. No thyromegaly present.  Cardiovascular: Normal rate.  Respiratory: Effort normal.  GI: Soft. There is no tenderness. There is no guarding.  S=D  Genitourinary:  Genitourinary Comments: SVE: posterior, long, closed  Musculoskeletal: Normal range of motion.  Neurological: She is alert and oriented to person, place, and time.  Skin: Skin is warm and dry.  Psychiatric: She has a normal mood and affect. Her behavior is normal.    MAU Course  Procedures  Fetal monitoring  Fetal heart rate baseline 150, moderate variability, 10-15 bpm accelerations, rare mild deceleration Toco: No contractions Monitored x 1hr 30 min, tracing discontinuous at times. Adjusted belt>frequent  audible FM and patient aware of most movements  Will discharge home with reassurance. Discussed movement awareness.    Assessment and Plan  22yo G2P1001 at [redacted]w[redacted]d 1. Decreased fetal movement affecting management of  pregnancy in second trimester, fetus 1    Allergies as of 05/19/2018   No Known Allergies     Medication List    STOP taking these medications   COMFORT FIT MATERNITY SUPP MED Misc   ondansetron 4 MG disintegrating tablet Commonly known as:  ZOFRAN-ODT     TAKE these medications   acetaminophen 500 MG tablet Commonly known as:  TYLENOL Take 1,000 mg by mouth every 6 (six) hours as needed for moderate pain.   hydrocortisone cream 1 % Apply to affected area 2 times daily   loratadine 10 MG tablet Commonly known as:  CLARITIN Take 10 mg by mouth daily.   PRENATAL GUMMIES/DHA & FA 0.4-32.5 MG Chew Chew 1 tablet by mouth daily.      Follow-up Information    Department, [facility] Follow up in 1 week(s).   Why:  Keep your scheduled prenatal appointment Contact information: [address] [place] [address] [phone]            Breanne Olvera Christy Gentles CNM 05/19/2018, 7:18 PM

## 2018-05-19 NOTE — MAU Note (Signed)
Pt stated she has not felt baby move since 1100 am. Reports some cramping as well Denies any bleeding or leaking or fluid.

## 2018-07-22 ENCOUNTER — Other Ambulatory Visit: Payer: Self-pay | Admitting: Obstetrics and Gynecology

## 2018-07-22 DIAGNOSIS — O365930 Maternal care for other known or suspected poor fetal growth, third trimester, not applicable or unspecified: Secondary | ICD-10-CM

## 2018-07-26 ENCOUNTER — Ambulatory Visit (INDEPENDENT_AMBULATORY_CARE_PROVIDER_SITE_OTHER): Payer: Medicaid Other | Admitting: Obstetrics and Gynecology

## 2018-07-26 ENCOUNTER — Other Ambulatory Visit (HOSPITAL_COMMUNITY)
Admission: RE | Admit: 2018-07-26 | Discharge: 2018-07-26 | Disposition: A | Discharge status: Home / Self Care | Payer: Medicaid Other | Source: Ambulatory Visit | Attending: Obstetrics and Gynecology | Admitting: Obstetrics and Gynecology

## 2018-07-26 ENCOUNTER — Encounter: Payer: Self-pay | Admitting: Obstetrics and Gynecology

## 2018-07-26 DIAGNOSIS — O0993 Supervision of high risk pregnancy, unspecified, third trimester: Secondary | ICD-10-CM | POA: Diagnosis not present

## 2018-07-26 DIAGNOSIS — Z3490 Encounter for supervision of normal pregnancy, unspecified, unspecified trimester: Secondary | ICD-10-CM | POA: Insufficient documentation

## 2018-07-26 DIAGNOSIS — O365930 Maternal care for other known or suspected poor fetal growth, third trimester, not applicable or unspecified: Secondary | ICD-10-CM | POA: Diagnosis not present

## 2018-07-26 DIAGNOSIS — Z3A36 36 weeks gestation of pregnancy: Secondary | ICD-10-CM | POA: Diagnosis not present

## 2018-07-26 NOTE — Progress Notes (Signed)
Subjective:  Makayla White is a 22 y.o. G2P1001 at [redacted]w[redacted]d being seen today for ongoing prenatal care. Transferred from [facility] d/t IUGR growth  3% on scan last week.  She is currently monitored for the following issues for this high-risk pregnancy and has Supervision of high risk pregnancy, antepartum, third trimester and IUGR (intrauterine growth restriction) affecting care of mother on their problem list.  Patient reports no complaints.  Contractions: Irritability. Vag. Bleeding: None.  Movement: Present. Denies leaking of fluid.   The following portions of the patient's history were reviewed and updated as appropriate: allergies, current medications, past family history, past medical history, past social history, past surgical history and problem list. Problem list updated.  Objective:   Vitals:   07/26/18 1311  BP: 117/71  Pulse: 92  Weight: 152 lb (68.9 kg)    Fetal Status: Fetal Heart Rate (bpm): 132   Movement: Present     General:  Alert, oriented and cooperative. Patient is in no acute distress.  Skin: Skin is warm and dry. No rash noted.   Cardiovascular: Normal heart rate noted  Respiratory: Normal respiratory effort, no problems with respiration noted  Abdomen: Soft, gravid, appropriate for gestational age. Pain/Pressure: Present     Pelvic:  Cervical exam performed        Extremities: Normal range of motion.  Edema: None  Mental Status: Normal mood and affect. Normal behavior. Normal judgment and thought content.   Urinalysis:      Assessment and Plan:  Pregnancy: G2P1001 at [redacted]w[redacted]d  1. Supervision of high risk pregnancy, antepartum, third trimester Stable - Strep Gp B NAA - GC/Chlamydia probe amp ([place])not at [facility]  2. Poor fetal growth affecting management of mother in third trimester, single or unspecified fetus IUGR reviewed with pt.  BPP and doppler studies tomorrow with MFM. Continued OB care based on U/S findings  Term labor symptoms and general  obstetric precautions including but not limited to vaginal bleeding, contractions, leaking of fluid and fetal movement were reviewed in detail with the patient. Please refer to After Visit Summary for other counseling recommendations.  Return in about 1 week (around 08/02/2018) for OB visit.   Hermina Staggers, MD

## 2018-07-26 NOTE — Patient Instructions (Signed)
Vaginal Delivery Vaginal delivery means that you will give birth by pushing your baby out of your birth canal (vagina). A team of health care providers will help you before, during, and after vaginal delivery. Birth experiences are unique for every woman and every pregnancy, and birth experiences vary depending on where you choose to give birth. What should I do to prepare for my baby's birth? Before your baby is born, it is important to talk with your health care provider about:  Your labor and delivery preferences. These may include: ? Medicines that you may be given. ? How you will manage your pain. This might include non-medical pain relief techniques or injectable pain relief such as epidural analgesia. ? How you and your baby will be monitored during labor and delivery. ? Who may be in the labor and delivery room with you. ? Your feelings about surgical delivery of your baby (cesarean delivery, or C-section) if this becomes necessary. ? Your feelings about receiving donated blood through an IV tube (blood transfusion) if this becomes necessary.  Whether you are able: ? To take pictures or videos of the birth. ? To eat during labor and delivery. ? To move around, walk, or change positions during labor and delivery.  What to expect after your baby is born, such as: ? Whether delayed umbilical cord clamping and cutting is offered. ? Who will care for your baby right after birth. ? Medicines or tests that may be recommended for your baby. ? Whether breastfeeding is supported in your hospital or birth center. ? How long you will be in the hospital or birth center.  How any medical conditions you have may affect your baby or your labor and delivery experience.  To prepare for your baby's birth, you should also:  Attend all of your health care visits before delivery (prenatal visits) as recommended by your health care provider. This is important.  Prepare your home for your baby's  arrival. Make sure that you have: ? Diapers. ? Baby clothing. ? Feeding equipment. ? Safe sleeping arrangements for you and your baby.  Install a car seat in your vehicle. Have your car seat checked by a certified car seat installer to make sure that it is installed safely.  Think about who will help you with your new baby at home for at least the first several weeks after delivery.  What can I expect when I arrive at the birth center or hospital? Once you are in labor and have been admitted into the hospital or birth center, your health care provider may:  Review your pregnancy history and any concerns you have.  Insert an IV tube into one of your veins. This is used to give you fluids and medicines.  Check your blood pressure, pulse, temperature, and heart rate (vital signs).  Check whether your bag of water (amniotic sac) has broken (ruptured).  Talk with you about your birth plan and discuss pain control options.  Monitoring Your health care provider may monitor your contractions (uterine monitoring) and your baby's heart rate (fetal monitoring). You may need to be monitored:  Often, but not continuously (intermittently).  All the time or for long periods at a time (continuously). Continuous monitoring may be needed if: ? You are taking certain medicines, such as medicine to relieve pain or make your contractions stronger. ? You have pregnancy or labor complications.  Monitoring may be done by:  Placing a special stethoscope or a handheld monitoring device on your abdomen to   check your baby's heartbeat, and feeling your abdomen for contractions. This method of monitoring does not continuously record your baby's heartbeat or your contractions.  Placing monitors on your abdomen (external monitors) to record your baby's heartbeat and the frequency and length of contractions. You may not have to wear external monitors all the time.  Placing monitors inside of your uterus  (internal monitors) to record your baby's heartbeat and the frequency, length, and strength of your contractions. ? Your health care provider may use internal monitors if he or she needs more information about the strength of your contractions or your baby's heart rate. ? Internal monitors are put in place by passing a thin, flexible wire through your vagina and into your uterus. Depending on the type of monitor, it may remain in your uterus or on your baby's head until birth. ? Your health care provider will discuss the benefits and risks of internal monitoring with you and will ask for your permission before inserting the monitors.  Telemetry. This is a type of continuous monitoring that can be done with external or internal monitors. Instead of having to stay in bed, you are able to move around during telemetry. Ask your health care provider if telemetry is an option for you.  Physical exam Your health care provider may perform a physical exam. This may include:  Checking whether your baby is positioned: ? With the head toward your vagina (head-down). This is most common. ? With the head toward the top of your uterus (head-up or breech). If your baby is in a breech position, your health care provider may try to turn your baby to a head-down position so you can deliver vaginally. If it does not seem that your baby can be born vaginally, your provider may recommend surgery to deliver your baby. In rare cases, you may be able to deliver vaginally if your baby is head-up (breech delivery). ? Lying sideways (transverse). Babies that are lying sideways cannot be delivered vaginally.  Checking your cervix to determine: ? Whether it is thinning out (effacing). ? Whether it is opening up (dilating). ? How low your baby has moved into your birth canal.  What are the three stages of labor and delivery?  Normal labor and delivery is divided into the following three stages: Stage 1  Stage 1 is the  longest stage of labor, and it can last for hours or days. Stage 1 includes: ? Early labor. This is when contractions may be irregular, or regular and mild. Generally, early labor contractions are more than 10 minutes apart. ? Active labor. This is when contractions get longer, more regular, more frequent, and more intense. ? The transition phase. This is when contractions happen very close together, are very intense, and may last longer than during any other part of labor.  Contractions generally feel mild, infrequent, and irregular at first. They get stronger, more frequent (about every 2-3 minutes), and more regular as you progress from early labor through active labor and transition.  Many women progress through stage 1 naturally, but you may need help to continue making progress. If this happens, your health care provider may talk with you about: ? Rupturing your amniotic sac if it has not ruptured yet. ? Giving you medicine to help make your contractions stronger and more frequent.  Stage 1 ends when your cervix is completely dilated to 4 inches (10 cm) and completely effaced. This happens at the end of the transition phase. Stage 2  Once   your cervix is completely effaced and dilated to 4 inches (10 cm), you may start to feel an urge to push. It is common for the body to naturally take a rest before feeling the urge to push, especially if you received an epidural or certain other pain medicines. This rest period may last for up to 1-2 hours, depending on your unique labor experience.  During stage 2, contractions are generally less painful, because pushing helps relieve contraction pain. Instead of contraction pain, you may feel stretching and burning pain, especially when the widest part of your baby's head passes through the vaginal opening (crowning).  Your health care provider will closely monitor your pushing progress and your baby's progress through the vagina during stage 2.  Your  health care provider may massage the area of skin between your vaginal opening and anus (perineum) or apply warm compresses to your perineum. This helps it stretch as the baby's head starts to crown, which can help prevent perineal tearing. ? In some cases, an incision may be made in your perineum (episiotomy) to allow the baby to pass through the vaginal opening. An episiotomy helps to make the opening of the vagina larger to allow more room for the baby to fit through.  It is very important to breathe and focus so your health care provider can control the delivery of your baby's head. Your health care provider may have you decrease the intensity of your pushing, to help prevent perineal tearing.  After delivery of your baby's head, the shoulders and the rest of the body generally deliver very quickly and without difficulty.  Once your baby is delivered, the umbilical cord may be cut right away, or this may be delayed for 1-2 minutes, depending on your baby's health. This may vary among health care providers, hospitals, and birth centers.  If you and your baby are healthy enough, your baby may be placed on your chest or abdomen to help maintain the baby's temperature and to help you bond with each other. Some mothers and babies start breastfeeding at this time. Your health care team will dry your baby and help keep your baby warm during this time.  Your baby may need immediate care if he or she: ? Showed signs of distress during labor. ? Has a medical condition. ? Was born too early (prematurely). ? Had a bowel movement before birth (meconium). ? Shows signs of difficulty transitioning from being inside the uterus to being outside of the uterus. If you are planning to breastfeed, your health care team will help you begin a feeding. Stage 3  The third stage of labor starts immediately after the birth of your baby and ends after you deliver the placenta. The placenta is an organ that develops  during pregnancy to provide oxygen and nutrients to your baby in the womb.  Delivering the placenta may require some pushing, and you may have mild contractions. Breastfeeding can stimulate contractions to help you deliver the placenta.  After the placenta is delivered, your uterus should tighten (contract) and become firm. This helps to stop bleeding in your uterus. To help your uterus contract and to control bleeding, your health care provider may: ? Give you medicine by injection, through an IV tube, by mouth, or through your rectum (rectally). ? Massage your abdomen or perform a vaginal exam to remove any blood clots that are left in your uterus. ? Empty your bladder by placing a thin, flexible tube (catheter) into your bladder. ? Encourage   you to breastfeed your baby. After labor is over, you and your baby will be monitored closely to ensure that you are both healthy until you are ready to go home. Your health care team will teach you how to care for yourself and your baby. This information is not intended to replace advice given to you by your health care provider. Make sure you discuss any questions you have with your health care provider. Document Released: 08/18/2008 Document Revised: 05/29/2016 Document Reviewed: 11/24/2015 Elsevier Interactive Patient Education  2018 [employer]. Intrauterine Growth Restriction Intrauterine growth restriction (IUGR) is when your baby is not growing normally during your pregnancy. A baby with IUGR is smaller than it should be and may weigh less than normal at birth. IUGR can result if there is a problem with the organ that supplies your baby with oxygen and nutrition (placenta). Usually, there is no way to prevent this type of problem. What are the causes? The most common cause of IUGR is a problem with the placenta or umbilical cord that causes your developing baby to get less oxygen or nutrition than needed. Other causes include:  Poor maternal  nutrition.  Chemicals found in substances such as cigarettes, alcohol, and some illegal drugs.  Some prescription medicines.  Congenital defects.  Genetic disorders.  An infection.  Carrying more than one baby, such as twins or triplets (multiple gestations).  What increases the risk? This condition is more likely to develop in women who:  Are over the age of 42 at the time of delivery (advanced maternal age).  Have medical conditions such as high blood pressure, diabetes, heart or kidney disease, anemia, or conditions that increase the risk for blood clotting.  Live at a very high altitude during pregnancy.  Have a personal history or family history of IUGR.  Take medicines during pregnancy that are linked with congenital disabilities.  Have a personal or family history of a genetic disorder.  Come into contact with infected cat feces (toxoplasmosis).  Come into contact with chickenpox (varicella) or [place] measles (rubella).  Have or are at risk of getting an infectious disease such as syphilis, HIV, or herpes.  Eat poorly during their pregnancy.  Weigh less than 100 pounds.  Have a family history of multiple gestations.  Have had infertility treatments.  Use tobacco, illegal drugs, or drink alcohol during pregnancy.  What are the signs or symptoms? IUGR does not cause many symptoms. You might notice that your baby does not move or kick very often. Also, your belly may not be as big as expected for the stage of your pregnancy. How is this diagnosed? This condition is diagnosed with physical and prenatal exams. Your health care provider will measure the size of your baby inside your womb during a routine screening exam using sound waves (ultrasound). Your health care provider will compare the size of your baby to the size of other babies at the same stage of development (gestational age). Your health care provider will diagnose IUGR if your baby is smaller than 90  percent of all other babies at the same gestational age. You may also have tests to find the cause of IUGR. These can include:  Having fluid removed from your womb to check for signs of infection or a congenital disability (amniocentesis).  A series of tests to monitor your baby's health (fetal monitoring).  How is this treated? In most cases, treatment for this condition focuses on stopping the cause of your baby's small growth. Your  health care providers will monitor your pregnancy closely and help you manage your pregnancy. If this condition is caused by a placenta problem and the baby is not getting enough blood, treatment may include:  Medicine to start labor and deliver your baby early (induction).  Cesarean delivery. In this procedure, your baby is delivered through a cut (incision) in the abdomen and womb (uterus).  Follow these instructions at home:  Make sure you are eating enough calories and gaining enough weight.  Eat a balanced diet. Work with a Dealer (dietitian), if needed.  Rest as needed. Try to get at least eight hours of sleep every night.  Do not drink alcohol.  Do not use illegal drugs.  Do not use any tobacco products, including cigarettes, chewing tobacco, or e-cigarettes. If you need help quitting, ask your health care provider.  Talk to your health care provider about steps you can take to avoid infections.  Take medicines, vitamins, and mineral supplements only as directed by your health care provider. Make sure that your health care provider knows about all of the prescribed or over-the-counter medicines, supplements, vitamins, eye drops, and creams that you are using.  Keep all follow-up visits as directed by your health care provider. This is important. Contact a health care provider if:  Your baby is not moving as often as before. This information is not intended to replace advice given to you by your health care provider. Make sure you  discuss any questions you have with your health care provider. Document Released: 08/18/2008 Document Revised: 04/16/2016 Document Reviewed: 11/05/2014 Elsevier Interactive Patient Education  [employer].

## 2018-07-27 ENCOUNTER — Other Ambulatory Visit: Payer: Self-pay | Admitting: Obstetrics and Gynecology

## 2018-07-27 ENCOUNTER — Ambulatory Visit (HOSPITAL_COMMUNITY)
Admission: RE | Admit: 2018-07-27 | Discharge: 2018-07-27 | Disposition: A | Discharge status: Home / Self Care | Payer: Medicaid Other | Source: Ambulatory Visit | Attending: Obstetrics and Gynecology | Admitting: Obstetrics and Gynecology

## 2018-07-27 ENCOUNTER — Encounter (HOSPITAL_COMMUNITY): Payer: Self-pay

## 2018-07-27 DIAGNOSIS — O365930 Maternal care for other known or suspected poor fetal growth, third trimester, not applicable or unspecified: Secondary | ICD-10-CM | POA: Diagnosis not present

## 2018-07-27 DIAGNOSIS — Z3A36 36 weeks gestation of pregnancy: Secondary | ICD-10-CM | POA: Diagnosis not present

## 2018-07-27 LAB — GC/CHLAMYDIA PROBE AMP (~~LOC~~) NOT AT ARMC
Chlamydia: NEGATIVE
Neisseria Gonorrhea: NEGATIVE

## 2018-07-28 ENCOUNTER — Other Ambulatory Visit (HOSPITAL_COMMUNITY): Payer: Self-pay | Admitting: *Deleted

## 2018-07-28 ENCOUNTER — Other Ambulatory Visit: Payer: Self-pay | Admitting: *Deleted

## 2018-07-28 DIAGNOSIS — O365930 Maternal care for other known or suspected poor fetal growth, third trimester, not applicable or unspecified: Secondary | ICD-10-CM

## 2018-07-28 DIAGNOSIS — O0993 Supervision of high risk pregnancy, unspecified, third trimester: Secondary | ICD-10-CM

## 2018-07-28 LAB — STREP GP B NAA: Strep Gp B NAA: POSITIVE — AB

## 2018-07-29 ENCOUNTER — Encounter (HOSPITAL_COMMUNITY): Payer: Self-pay

## 2018-08-03 ENCOUNTER — Ambulatory Visit (HOSPITAL_BASED_OUTPATIENT_CLINIC_OR_DEPARTMENT_OTHER)
Admission: RE | Admit: 2018-08-03 | Discharge: 2018-08-03 | Disposition: A | Discharge status: Home / Self Care | Payer: Medicaid Other | Source: Ambulatory Visit | Attending: Obstetrics & Gynecology | Admitting: Obstetrics & Gynecology

## 2018-08-03 ENCOUNTER — Encounter: Payer: Self-pay | Admitting: Obstetrics and Gynecology

## 2018-08-03 ENCOUNTER — Encounter (HOSPITAL_COMMUNITY): Payer: Self-pay

## 2018-08-03 ENCOUNTER — Ambulatory Visit (INDEPENDENT_AMBULATORY_CARE_PROVIDER_SITE_OTHER): Payer: Medicaid Other | Admitting: Obstetrics and Gynecology

## 2018-08-03 VITALS — BP 118/77 | HR 96 | Wt 148.0 lb

## 2018-08-03 DIAGNOSIS — O0993 Supervision of high risk pregnancy, unspecified, third trimester: Secondary | ICD-10-CM

## 2018-08-03 DIAGNOSIS — O365930 Maternal care for other known or suspected poor fetal growth, third trimester, not applicable or unspecified: Secondary | ICD-10-CM

## 2018-08-03 DIAGNOSIS — Z3A37 37 weeks gestation of pregnancy: Secondary | ICD-10-CM

## 2018-08-03 NOTE — Progress Notes (Signed)
   PRENATAL VISIT NOTE  Subjective:  Makayla White is a 22 y.o. G2P1001 at [redacted]w[redacted]d being seen today for ongoing prenatal care.  She is currently monitored for the following issues for this high-risk pregnancy and has Supervision of high risk pregnancy, antepartum, third trimester and IUGR (intrauterine growth restriction) affecting care of mother on their problem list.  Patient reports no complaints.  Contractions: Irritability. Vag. Bleeding: None.  Movement: Present. Denies leaking of fluid.   The following portions of the patient's history were reviewed and updated as appropriate: allergies, current medications, past family history, past medical history, past social history, past surgical history and problem list. Problem list updated.  Objective:   Vitals:   08/03/18 1349  BP: 118/77  Pulse: 96  Weight: 148 lb (67.1 kg)    Fetal Status: Fetal Heart Rate (bpm): 140 Fundal Height: 34 cm Movement: Present     General:  Alert, oriented and cooperative. Patient is in no acute distress.  Skin: Skin is warm and dry. No rash noted.   Cardiovascular: Normal heart rate noted  Respiratory: Normal respiratory effort, no problems with respiration noted  Abdomen: Soft, gravid, appropriate for gestational age.  Pain/Pressure: Present     Pelvic: Cervical exam deferred        Extremities: Normal range of motion.  Edema: None  Mental Status: Normal mood and affect. Normal behavior. Normal judgment and thought content.   Assessment and Plan:  Pregnancy: G2P1001 at [redacted]w[redacted]d  1. Supervision of high risk pregnancy, antepartum, third trimester Patient is doing well without complaints  2. Poor fetal growth affecting management of mother in third trimester, single or unspecified fetus Patient with IUGR and normal dopplers on 9/4 Small pericardial effusion on 9/6 Patient scheduled for BPP with dopplers today. Case discussed with Dr. Judeth Cornfield for timing of delivery  Term labor symptoms and  general obstetric precautions including but not limited to vaginal bleeding, contractions, leaking of fluid and fetal movement were reviewed in detail with the patient. Please refer to After Visit Summary for other counseling recommendations.  No follow-ups on file.  Future Appointments  Date Time Provider Department Center  08/03/2018  3:30 PM [facility] [place] 5 [facility]  08/10/2018  2:00 PM [facility] [place] 3 [facility]  08/17/2018  9:45 AM [facility] [place] 2 [facility]    Catalina Antigua, MD

## 2018-08-04 ENCOUNTER — Inpatient Hospital Stay (HOSPITAL_COMMUNITY)
Admission: AD | Admit: 2018-08-04 | Discharge: 2018-08-06 | DRG: 776 | Disposition: A | Discharge status: Home / Self Care | Payer: Medicaid Other | Attending: Obstetrics and Gynecology | Admitting: Obstetrics and Gynecology

## 2018-08-04 ENCOUNTER — Other Ambulatory Visit: Payer: Self-pay

## 2018-08-04 ENCOUNTER — Encounter (HOSPITAL_COMMUNITY): Payer: Self-pay

## 2018-08-04 DIAGNOSIS — Z23 Encounter for immunization: Secondary | ICD-10-CM

## 2018-08-04 DIAGNOSIS — Z3A37 37 weeks gestation of pregnancy: Secondary | ICD-10-CM | POA: Diagnosis not present

## 2018-08-04 DIAGNOSIS — O365930 Maternal care for other known or suspected poor fetal growth, third trimester, not applicable or unspecified: Secondary | ICD-10-CM | POA: Diagnosis not present

## 2018-08-04 DIAGNOSIS — O0993 Supervision of high risk pregnancy, unspecified, third trimester: Secondary | ICD-10-CM

## 2018-08-04 DIAGNOSIS — O99824 Streptococcus B carrier state complicating childbirth: Secondary | ICD-10-CM | POA: Diagnosis not present

## 2018-08-04 LAB — CBC
HCT: 33 % — ABNORMAL LOW (ref 36.0–46.0)
Hemoglobin: 10.6 g/dL — ABNORMAL LOW (ref 12.0–15.0)
MCH: 27.7 pg (ref 26.0–34.0)
MCHC: 32.1 g/dL (ref 30.0–36.0)
MCV: 86.2 fL (ref 78.0–100.0)
PLATELETS: 356 10*3/uL (ref 150–400)
RBC: 3.83 MIL/uL — AB (ref 3.87–5.11)
RDW: 14.7 % (ref 11.5–15.5)
WBC: 15.6 10*3/uL — AB (ref 4.0–10.5)

## 2018-08-04 LAB — TYPE AND SCREEN
ABO/RH(D): A POS
Antibody Screen: NEGATIVE

## 2018-08-04 LAB — HEPATITIS B SURFACE ANTIGEN: HEP B S AG: NEGATIVE

## 2018-08-04 MED ORDER — SIMETHICONE 80 MG PO CHEW: 80.0000 mg | CHEWABLE_TABLET | ORAL | Status: DC | PRN

## 2018-08-04 MED ORDER — ACETAMINOPHEN 325 MG PO TABS: 650.0000 mg | ORAL_TABLET | ORAL | Status: DC | PRN

## 2018-08-04 MED ORDER — DIBUCAINE 1 % RE OINT: 1.0000 "application " | TOPICAL_OINTMENT | RECTAL | Status: DC | PRN

## 2018-08-04 MED ORDER — OXYTOCIN 40 UNITS IN LACTATED RINGERS INFUSION - SIMPLE MED: 2.5000 [IU]/h | INTRAVENOUS | Status: DC

## 2018-08-04 MED ORDER — SOD CITRATE-CITRIC ACID 500-334 MG/5ML PO SOLN: 30.0000 mL | ORAL | Status: DC | PRN

## 2018-08-04 MED ORDER — ONDANSETRON HCL 4 MG/2ML IJ SOLN: 4.0000 mg | INTRAMUSCULAR | Status: DC | PRN

## 2018-08-04 MED ORDER — DIPHENHYDRAMINE HCL 25 MG PO CAPS: 25.0000 mg | ORAL_CAPSULE | Freq: Four times a day (QID) | ORAL | Status: DC | PRN

## 2018-08-04 MED ORDER — PRENATAL MULTIVITAMIN CH
1.0000 | ORAL_TABLET | Freq: Every day | ORAL | Status: DC
  Administered 2018-08-04 – 2018-08-06 (×3): 1 via ORAL
  Filled 2018-08-04 (×3): qty 1

## 2018-08-04 MED ORDER — COCONUT OIL OIL
1.0000 "application " | TOPICAL_OIL | Status: DC | PRN
  Filled 2018-08-04: qty 120

## 2018-08-04 MED ORDER — INFLUENZA VAC SPLIT QUAD 0.5 ML IM SUSY
0.5000 mL | PREFILLED_SYRINGE | INTRAMUSCULAR | Status: AC
  Administered 2018-08-06: 0.5 mL via INTRAMUSCULAR
  Filled 2018-08-04: qty 0.5

## 2018-08-04 MED ORDER — BENZOCAINE-MENTHOL 20-0.5 % EX AERO
1.0000 "application " | INHALATION_SPRAY | CUTANEOUS | Status: DC | PRN
  Filled 2018-08-04: qty 56

## 2018-08-04 MED ORDER — WITCH HAZEL-GLYCERIN EX PADS: 1.0000 "application " | MEDICATED_PAD | CUTANEOUS | Status: DC | PRN

## 2018-08-04 MED ORDER — LACTATED RINGERS IV SOLN: INTRAVENOUS | Status: DC

## 2018-08-04 MED ORDER — ZOLPIDEM TARTRATE 5 MG PO TABS: 5.0000 mg | ORAL_TABLET | Freq: Every evening | ORAL | Status: DC | PRN

## 2018-08-04 MED ORDER — LIDOCAINE HCL (PF) 1 % IJ SOLN
30.0000 mL | INTRAMUSCULAR | Status: DC | PRN
  Filled 2018-08-04: qty 30

## 2018-08-04 MED ORDER — TETANUS-DIPHTH-ACELL PERTUSSIS 5-2.5-18.5 LF-MCG/0.5 IM SUSP: 0.5000 mL | Freq: Once | INTRAMUSCULAR | Status: DC

## 2018-08-04 MED ORDER — LACTATED RINGERS IV SOLN: 500.0000 mL | INTRAVENOUS | Status: DC | PRN

## 2018-08-04 MED ORDER — OXYTOCIN BOLUS FROM INFUSION: 500.0000 mL | Freq: Once | INTRAVENOUS | Status: DC

## 2018-08-04 MED ORDER — OXYCODONE-ACETAMINOPHEN 5-325 MG PO TABS: 2.0000 | ORAL_TABLET | ORAL | Status: DC | PRN

## 2018-08-04 MED ORDER — OXYCODONE-ACETAMINOPHEN 5-325 MG PO TABS: 1.0000 | ORAL_TABLET | ORAL | Status: DC | PRN

## 2018-08-04 MED ORDER — SENNOSIDES-DOCUSATE SODIUM 8.6-50 MG PO TABS
2.0000 | ORAL_TABLET | ORAL | Status: DC
  Administered 2018-08-04 – 2018-08-05 (×2): 2 via ORAL
  Filled 2018-08-04 (×2): qty 2

## 2018-08-04 MED ORDER — ONDANSETRON HCL 4 MG PO TABS: 4.0000 mg | ORAL_TABLET | ORAL | Status: DC | PRN

## 2018-08-04 MED ORDER — ONDANSETRON HCL 4 MG/2ML IJ SOLN: 4.0000 mg | Freq: Four times a day (QID) | INTRAMUSCULAR | Status: DC | PRN

## 2018-08-04 MED ORDER — IBUPROFEN 600 MG PO TABS
600.0000 mg | ORAL_TABLET | Freq: Four times a day (QID) | ORAL | Status: DC
  Administered 2018-08-04 – 2018-08-06 (×9): 600 mg via ORAL
  Filled 2018-08-04 (×9): qty 1

## 2018-08-04 NOTE — H&P (Addendum)
OBSTETRIC ADMISSION HISTORY AND PHYSICAL  Makayla White is a 22 y.o. female G2P1001 with IUP at 5264w5d presenting after precipitous delivery in ambulance. No blurry vision, headaches or peripheral edema, and RUQ pain.  She plans on breast feeding. She request IUD for birth control. [facility]   Dating: By LMP --->  Estimated Date of Delivery: 08/20/18  Sono:    @[redacted]w[redacted]d , CWD, mild pericardial effusion, mild biventricular hypertrophy, cephalic presentation, 34% EFW   Prenatal History/Complications:  Past Medical History: Past Medical History:  Diagnosis Date  . Asthma   . Scoliosis     Past Surgical History: Past Surgical History:  Procedure Laterality Date  . NOSE SURGERY    . TONSILLECTOMY      Obstetrical History: OB History    Gravida  2   Para  1   Term  1   Preterm      AB      Living  1     SAB      TAB      Ectopic      Multiple  0   Live Births  1           Social History: Social History   Socioeconomic History  . Marital status: Single    Spouse name: Not on file  . Number of children: Not on file  . Years of education: Not on file  . Highest education level: Not on file  Occupational History  . Not on file  Social Needs  . Financial resource strain: Not on file  . Food insecurity:    Worry: Not on file    Inability: Not on file  . Transportation needs:    Medical: Not on file    Non-medical: Not on file  Tobacco Use  . Smoking status: Never Smoker  . Smokeless tobacco: Never Used  Substance and Sexual Activity  . Alcohol use: No  . Drug use: No  . Sexual activity: Yes  Lifestyle  . Physical activity:    Days per week: Not on file    Minutes per session: Not on file  . Stress: Not on file  Relationships  . Social connections:    Talks on phone: Not on file    Gets together: Not on file    Attends religious service: Not on file    Active member of club or organization: Not on file    Attends meetings of clubs or organizations: Not on file    Relationship status: Not on file  Other Topics Concern  . Not on file  Social History Narrative  . Not on file    Family History: History reviewed. No pertinent family history.  Allergies: No Known Allergies  Medications Prior to Admission  Medication Sig Dispense Refill Last Dose  . acetaminophen (TYLENOL) 500 MG tablet Take 1,000 mg by mouth every 6 (six) hours as needed for moderate pain.   Taking  . hydrocortisone cream 1 % Apply to affected area 2 times daily (Patient not taking: Reported on 07/26/2018) 15 g 0 Not Taking  . loratadine (CLARITIN) 10 MG tablet Take 10 mg by mouth daily.   Not Taking  . Prenatal MV-Min-FA-Omega-3 (PRENATAL GUMMIES/DHA & FA) 0.4-32.5 MG CHEW Chew 1 tablet by mouth daily. (Patient not taking: Reported on 07/27/2018) 30 tablet 6 Not Taking  . Prenatal MV-Min-Fe Fum-FA-DHA (PRENATAL 1 PO) Take by mouth.   Taking     Review of Systems  All systems reviewed and negative except as stated in HPI  Last menstrual period 11/05/2017, unknown if currently breastfeeding. General appearance: alert, cooperative, appears stated age and mild distress Lungs: clear to auscultation bilaterally Heart: regular rate and rhythm Abdomen: soft, non-tender; bowel sounds normal Pelvic: fetus delivered, placenta not delivered Extremities: Homans sign is negative, no sign of DVT Presentation: delivered Fetal monitoringn/a Uterine activity n/a      Prenatal labs: ABO, Rh:   Antibody:   Rubella:   RPR:    HBsAg:    HIV:    GBS: Positive (09/03 1531)  1 hr Glucola not available Genetic screening  Not available Anatomy US small pericardial effusion, mild biventricular hypertrophy  Prenatal Transfer Tool  Maternal Diabetes: No Genetic Screening: Normal Maternal Ultrasounds/Referrals: Normal Fetal Ultrasounds or other Referrals:  Fetal echo, Referred to Materal Fetal Medicine  Maternal Substance Abuse:   No Significant Maternal Medications:  None Significant Maternal Lab Results: None  No results found for this or any previous visit (from the past 24 hour(s)).  Patient Active Problem List   Diagnosis Date Noted  . Precipitous delivery 08/04/2018  . Supervision of high risk pregnancy, antepartum, third trimester 07/26/2018  . Fetal abnormality affecting management of mother 07/26/2018    Assessment/Plan:  Makayla White is a 22 y.o. G2P1001 at [redacted]w[redacted]d here for precipitous delivery at home.  #Labor:s/p delivery at home, delivered placenta w/o difficulty #Pain: ibuprofen #FWB: Will go to NICU #ID:  GBS +, no antibiotics given #MOF: breast #MOC:IUD #Circ:  yes  Denzil Hughes, MD  08/04/2018, 8:05 AM  I confirm that I have verified the information documented in the resident's note and that I have also personally reperformed the physical exam and all medical decision making activities. The patient was seen and examined by me also Agree with note NST reactive and reassuring UCs as listed Cervical exams as listed in note  Aviva Signs, CNM

## 2018-08-04 NOTE — Lactation Note (Signed)
This note was copied from a baby's chart. Lactation Consultation Note; Initial visit with this mom of 37.5 week baby in NICU. Mom has just finished her first pumping. Obtained about 4 ml of Colostrum. Discouraged that she didn't obtain more. Encouragement given. Reviewed setup, use and cleaning of pump pieces. Encouraged to pump q 3 hours 8 times/24 hours. No questions at present. Breast feeding brochure and NICU booklet given to mom. Mom has WIC in [place] I will send referral to them.   Patient Name: Makayla White NFAOZ'HToday's Date: 08/04/2018 Reason for consult: Initial assessment;Early term 37-38.6wks;NICU baby   Maternal Data Formula Feeding for Exclusion: No Has patient been taught Hand Expression?: Yes Does the patient have breastfeeding experience prior to this delivery?: Yes  Feeding    LATCH Score                   Interventions    Lactation Tools Discussed/Used WIC Program: Yes Pump Review: Setup, frequency, and cleaning Initiated by:: RN Date initiated:: 08/04/18   Consult Status Consult Status: Follow-up Date: 08/05/18 Follow-up type: In-patient    Pamelia HoitWeeks, Latoyia Tecson D 08/04/2018, 10:32 AM

## 2018-08-05 ENCOUNTER — Encounter (HOSPITAL_COMMUNITY): Payer: Self-pay

## 2018-08-05 LAB — RPR: RPR Ser Ql: NONREACTIVE

## 2018-08-05 NOTE — Lactation Note (Signed)
This note was copied from a baby's chart. Lactation Consultation Note  Patient Name: Girl [name] WUJWJ'XToday's Date: 08/05/2018 Reason for consult: Follow-up assessment;Early term 37-38.6wks;Infant < 6lbs Baby was transferred from the NICU to mom's room last night.  She states she is formula feeding baby because baby won't latch.  She used a nipple shield with her first baby and breastfed for 5 months.  Baby recently finished a formula feeding and is sleeping.  Instructed to call for assist with next feeding .  Stressed importance of getting baby to breast or pumping every 3 hours to establish and maintain milk supply.  Maternal Data    Feeding    LATCH Score                   Interventions    Lactation Tools Discussed/Used     Consult Status Consult Status: Follow-up Date: 08/06/18 Follow-up type: In-patient    Huston FoleyMOULDEN, Jayde Mcallister S 08/05/2018, 9:24 AM
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
## 2018-08-05 NOTE — Progress Notes (Signed)
Post Partum Day 1 s/p SVD at 8154w5d at home, infant is SGA Subjective: No complaints, up ad lib, voiding, tolerating PO and + flatus  Baby girl is stable at bedside, breast and bottle feeding. Moderate lochia  Objective: Blood pressure 111/62, pulse 79, temperature (!) 97.5 F (36.4 C), temperature source Oral, resp. rate 18, height 5\' 5"  (1.651 m), weight 67.6 kg, last menstrual period 11/05/2017, SpO2 100 %, unknown if currently breastfeeding.  Physical Exam:  General: alert and no distress Lochia: appropriate Uterine Fundus: firm Incision: N/A DVT Evaluation: No evidence of DVT seen on physical exam. Negative Homan's sign. No cords or calf tenderness. No significant calf/ankle edema.  Recent Labs    08/04/18 0921  HGB 10.6*  HCT 33.0*    Assessment/Plan:  Breast and bottlefeeding and Contraception IUD Plan for discharge tomorrow Routine postpartum care   LOS: 1 day   Jaynie CollinsUgonna Alba Kriesel, MD 08/05/2018, 10:15 AM

## 2018-08-06 ENCOUNTER — Ambulatory Visit: Payer: Self-pay

## 2018-08-06 MED ORDER — IBUPROFEN 600 MG PO TABS: 600.0000 mg | ORAL_TABLET | Freq: Four times a day (QID) | ORAL | 2 refills | Status: DC | PRN

## 2018-08-06 NOTE — Discharge Summary (Signed)
OB Discharge Summary     Patient Name: Makayla White DOB: 1996/04/10 MRN: [id number]  Date of admission: 08/04/2018 Delivering MD:     Date of discharge: 08/06/2018  Admitting diagnosis: delivered at home Intrauterine pregnancy: [redacted]w[redacted]d     Secondary diagnosis:  Active Problems:   Supervision of high risk pregnancy, antepartum, third trimester   Small for gestational age (SGA)   Precipitous delivery  Additional problems: None     Discharge diagnosis: Term Pregnancy Delivered                                                                                                Post partum procedures:None   Complications: None  Hospital course:  Onset of Labor With Vaginal Delivery     22 y.o. yo [id number] at [redacted]w[redacted]d was admitted after having a precipitous , uncomplicated SVD in ambulance en route to hospital.  Placental delivered without complication in hospital. She had an uncomplicated postpartum course.  She is ambulating, tolerating a regular diet, passing flatus, and urinating well. Patient is discharged home in stable condition on 08/06/18.   Physical exam  Vitals:   08/05/18 1529 08/05/18 2041 08/05/18 2206 08/06/18 0544  BP: 129/66 111/78 116/71 (!) 111/59  Pulse: 90 91 (!) 102 69  Resp: 18 18 18 18   Temp: 98.5 F (36.9 C) (!) 97.4 F (36.3 C) 97.6 F (36.4 C) 98.1 F (36.7 C)  TempSrc: Oral  Oral Oral  SpO2: 97% 98% 98% 100%  Weight:      Height:       General: alert, cooperative and no distress Lochia: appropriate Uterine Fundus: firm DVT Evaluation: No evidence of DVT seen on physical exam. Negative Homan's sign. No cords or calf tenderness.  Labs: Lab Results  Component Value Date   WBC 15.6 (H) 08/04/2018   HGB 10.6 (L) 08/04/2018   HCT 33.0 (L) 08/04/2018   MCV 86.2 08/04/2018   PLT 356 08/04/2018   CMP Latest Ref Rng & Units 10/26/2017  Glucose 65 - 99 mg/dL 147(W)  BUN 6 - 20 mg/dL 10  Creatinine 2.95 - 6.21 mg/dL 3.08  Sodium 657 - 846 mmol/L  136  Potassium 3.5 - 5.1 mmol/L 4.5  Chloride 101 - 111 mmol/L 106  CO2 22 - 32 mmol/L 23  Calcium 8.9 - 10.3 mg/dL 9.2  Total Protein 6.5 - 8.1 g/dL 8.2(H)  Total Bilirubin 0.3 - 1.2 mg/dL 0.7  Alkaline Phos 38 - 126 U/L 132(H)  AST 15 - 41 U/L 22  ALT 14 - 54 U/L 15    Discharge instruction: per After Visit Summary and "Baby and Me Booklet".  After visit meds:  Allergies as of 08/06/2018   No Known Allergies     Medication List    TAKE these medications   ibuprofen 600 MG tablet Commonly known as:  ADVIL,MOTRIN Take 1 tablet (600 mg total) by mouth every 6 (six) hours as needed for moderate pain or cramping.   PRENATAL 1 PO Take by mouth.       Diet: routine diet  Activity: Advance as tolerated. Pelvic rest  for 6 weeks.    Future Appointments  Date Time Provider Department Center  09/01/2018  8:45 AM Sharyon Cableogers, Veronica C, CNM [facility] None   Postpartum contraception: IUD Progestin  Newborn Data: Live born female  Birth Weight: 4 lb 5 oz (1956 g) APGAR: ,   Newborn Delivery   Birth date/time:  08/04/2016 05:52:00 Delivery type:  Vaginal, Spontaneous     Baby Feeding: Bottle and Breast Disposition:home with mother   08/06/2018 Jaynie CollinsUgonna Aristotle Lieb, MD

## 2018-08-06 NOTE — Lactation Note (Signed)
This note was copied from a baby's chart. Lactation Consultation Note: Mom unsure if she wants to continue breast feeding. Reports baby does not latch and she "doesn't have any milk" Reports she pumped 2 times yesterday but did not obtain any milk. Reports breasts are feeling very heavy this morning. Offered assist with pumping and mom agreeable. States she doesn't want to get engorged. Reviewed engorgement prevention and treatment. Mom pumped- obtained about 2 ml. Encouraged hand expression after pumping but mom doesn't want to- she wants to order breakfast. Reviewed our phone number, OP appointments as resources for support after DC. No questions at present, Encouraged to feed EBM to baby at next feeding. [facility]   Patient Name: Makayla [name] AVWUJ'WToday's Date: 08/06/2018 Reason for consult: Follow-up assessment;Early term 37-38.6wks;Infant < 6lbs   Maternal Data Formula Feeding for Exclusion: No Has patient been taught Hand Expression?: Yes Does the patient have breastfeeding experience prior to this delivery?: Yes  Feeding Feeding Type: Formula Nipple Type: Slow - flow  LATCH Score                   Interventions Interventions: Coconut oil  Lactation Tools Discussed/Used WIC Program: Yes Pump Review: Setup, frequency, and cleaning   Consult Status Consult Status: PRN    Pamelia HoitWeeks, Shawneen Deetz D 08/06/2018, 8:31 AM

## 2018-08-06 NOTE — Lactation Note (Signed)
This note was copied from a baby's chart. Lactation Consultation Note  Patient Name: Makayla White AVWUJ'WToday's Date: 08/06/2018 Reason for consult: Follow-up assessment;Early term 37-38.6wks;Infant < 6lbs;Difficult latch  P2 mother whose infant is now 8159 hours old.  This is an ETI at 37+5 weeks weighing  <6 lbs.  Baby was sleeping in bassinet when I arrived.  Mother fed approximately 1 hour ago.  Baby was not showing any feeding cues.  Mother was not having much success obtaining milk with pumping yesterday but her milk has CTV today.  She had pumped 50 mls earlier today and used that as supplementation for baby.  Mother stated that baby will not latch and she has not had help with latching.  I offered to assist with the next feeding and mother accepted.  She will call me for assistance when baby shows cues or at the 3 hour interval if she does not show cues.  She feels "very full" and is going to pump just a little for comfort level after getting a shower.  Mother is leaking and nursing pads provided.  I will wait for mother to call for next latch assist.     Maternal Data Formula Feeding for Exclusion: No Has patient been taught Hand Expression?: Yes Does the patient have breastfeeding experience prior to this delivery?: Yes  Feeding Feeding Type: Bottle Fed - Breast Milk Nipple Type: Slow - flow  LATCH Score                   Interventions    Lactation Tools Discussed/Used     Consult Status Consult Status: Follow-up Date: 08/07/18 Follow-up type: In-patient    Makayla White 08/06/2018, 5:02 PM

## 2018-08-06 NOTE — Discharge Instructions (Signed)
Vaginal Delivery, Care After °Refer to this sheet in the next few weeks. These instructions provide you with information about caring for yourself after vaginal delivery. Your health care provider may also give you more specific instructions. Your treatment has been planned according to current medical practices, but problems sometimes occur. Call your health care provider if you have any problems or questions. °What can I expect after the procedure? °After vaginal delivery, it is common to have: °· Some bleeding from your vagina. °· Soreness in your abdomen, your vagina, and the area of skin between your vaginal opening and your anus (perineum). °· Pelvic cramps. °· Fatigue. ° °Follow these instructions at home: °Medicines °· Take over-the-counter and prescription medicines only as told by your health care provider. °· If you were prescribed an antibiotic medicine, take it as told by your health care provider. Do not stop taking the antibiotic until it is finished. °Driving ° °· Do not drive or operate heavy machinery while taking prescription pain medicine. °· Do not drive for 24 hours if you received a sedative. °Lifestyle °· Do not drink alcohol. This is especially important if you are breastfeeding or taking medicine to relieve pain. °· Do not use tobacco products, including cigarettes, chewing tobacco, or e-cigarettes. If you need help quitting, ask your health care provider. °Eating and drinking °· Drink at least 8 eight-ounce glasses of water every day unless you are told not to by your health care provider. If you choose to breastfeed your baby, you may need to drink more water than this. °· Eat high-fiber foods every day. These foods may help prevent or relieve constipation. High-fiber foods include: °? Whole grain cereals and breads. °? Brown rice. °? Beans. °? Fresh fruits and vegetables. °Activity °· Return to your normal activities as told by your health care provider. Ask your health care provider  what activities are safe for you. °· Rest as much as possible. Try to rest or take a nap when your baby is sleeping. °· Do not lift anything that is heavier than your baby or 10 lb (4.5 kg) until your health care provider says that it is safe. °· Talk with your health care provider about when you can engage in sexual activity. This may depend on your: °? Risk of infection. °? Rate of healing. °? Comfort and desire to engage in sexual activity. °Vaginal Care °· If you have an episiotomy or a vaginal tear, check the area every day for signs of infection. Check for: °? More redness, swelling, or pain. °? More fluid or blood. °? Warmth. °? Pus or a bad smell. °· Do not use tampons or douches until your health care provider says this is safe. °· Watch for any blood clots that may pass from your vagina. These may look like clumps of dark red, brown, or black discharge. °General instructions °· Keep your perineum clean and dry as told by your health care provider. °· Wear loose, comfortable clothing. °· Wipe from front to back when you use the toilet. °· Ask your health care provider if you can shower or take a bath. If you had an episiotomy or a perineal tear during labor and delivery, your health care provider may tell you not to take baths for a certain length of time. °· Wear a bra that supports your breasts and fits you well. °· If possible, have someone help you with household activities and help care for your baby for at least a few days after   you leave the hospital. °· Keep all follow-up visits for you and your baby as told by your health care provider. This is important. °Contact a health care provider if: °· You have: °? Vaginal discharge that has a bad smell. °? Difficulty urinating. °? Pain when urinating. °? A sudden increase or decrease in the frequency of your bowel movements. °? More redness, swelling, or pain around your episiotomy or vaginal tear. °? More fluid or blood coming from your episiotomy or  vaginal tear. °? Pus or a bad smell coming from your episiotomy or vaginal tear. °? A fever. °? A rash. °? Little or no interest in activities you used to enjoy. °? Questions about caring for yourself or your baby. °· Your episiotomy or vaginal tear feels warm to the touch. °· Your episiotomy or vaginal tear is separating or does not appear to be healing. °· Your breasts are painful, hard, or turn red. °· You feel unusually sad or worried. °· You feel nauseous or you vomit. °· You pass large blood clots from your vagina. If you pass a blood clot from your vagina, save it to show to your health care provider. Do not flush blood clots down the toilet without having your health care provider look at them. °· You urinate more than usual. °· You are dizzy or light-headed. °· You have not breastfed at all and you have not had a menstrual period for 12 weeks after delivery. °· You have stopped breastfeeding and you have not had a menstrual period for 12 weeks after you stopped breastfeeding. °Get help right away if: °· You have: °? Pain that does not go away or does not get better with medicine. °? Chest pain. °? Difficulty breathing. °? Blurred vision or spots in your vision. °? Thoughts about hurting yourself or your baby. °· You develop pain in your abdomen or in one of your legs. °· You develop a severe headache. °· You faint. °· You bleed from your vagina so much that you fill two sanitary pads in one hour. °This information is not intended to replace advice given to you by your health care provider. Make sure you discuss any questions you have with your health care provider. °Document Released: 11/06/2000 Document Revised: 04/22/2016 Document Reviewed: 11/24/2015 °Elsevier Interactive Patient Education © 2018 [employer] ° °

## 2018-08-07 ENCOUNTER — Ambulatory Visit: Payer: Self-pay

## 2018-08-07 NOTE — Lactation Note (Signed)
This note was copied from a baby's chart. Lactation Consultation Note; Mom's breasts are full.She has not pumped since 10 pm. Offered assist with latch and mom agreeable. Nipples are flat. Mom asking about NS since she used one with her first baby. #wo NS used and baby latched well. Breast softer and baby off to sleep. Mom able to place NS on nipple by herself after review. Pumping as I left room. Encouraged to pump q 3 hours to prevent engorgement. No questions at present. Pleased she had nursed so well.TO call prn  Patient Name: Makayla White ZOXWR'UToday's Date: 08/07/2018 Reason for consult: Follow-up assessment;Infant < 6lbs;Early term 37-38.6wks   Maternal Data Formula Feeding for Exclusion: No Has patient been taught Hand Expression?: Yes Does the patient have breastfeeding experience prior to this delivery?: Yes  Feeding Feeding Type: Breast Fed Length of feed: 15 min  LATCH Score Latch: Grasps breast easily, tongue down, lips flanged, rhythmical sucking.(with NS)  Audible Swallowing: A few with stimulation  Type of Nipple: Flat  Comfort (Breast/Nipple): Filling, red/small blisters or bruises, mild/mod discomfort  Hold (Positioning): Assistance needed to correctly position infant at breast and maintain latch.  LATCH Score: 6  Interventions Interventions: Breast feeding basics reviewed  Lactation Tools Discussed/Used Tools: Nipple Shields Nipple shield size: 20 WIC Program: Yes   Consult Status Consult Status: Complete    Pamelia HoitWeeks, Eren Puebla D 08/07/2018, 9:59 AM

## 2018-08-10 ENCOUNTER — Other Ambulatory Visit (HOSPITAL_COMMUNITY): Payer: Medicaid Other

## 2018-08-10 ENCOUNTER — Encounter: Payer: Medicaid Other | Admitting: Obstetrics and Gynecology

## 2018-08-17 ENCOUNTER — Other Ambulatory Visit (HOSPITAL_COMMUNITY): Payer: Medicaid Other

## 2018-09-01 ENCOUNTER — Ambulatory Visit: Payer: Medicaid Other | Admitting: Certified Nurse Midwife

## 2018-09-08 ENCOUNTER — Ambulatory Visit: Payer: Medicaid Other | Admitting: Certified Nurse Midwife

## 2018-09-27 ENCOUNTER — Ambulatory Visit: Payer: Medicaid Other | Admitting: Obstetrics

## 2018-12-10 ENCOUNTER — Encounter: Payer: Self-pay | Admitting: Emergency Medicine

## 2018-12-10 ENCOUNTER — Emergency Department (HOSPITAL_COMMUNITY)
Admission: EM | Admit: 2018-12-10 | Discharge: 2018-12-10 | Disposition: A | Discharge status: Home / Self Care | Payer: Medicaid Other | Attending: Emergency Medicine | Admitting: Emergency Medicine

## 2018-12-10 DIAGNOSIS — Z79899 Other long term (current) drug therapy: Secondary | ICD-10-CM | POA: Insufficient documentation

## 2018-12-10 DIAGNOSIS — M419 Scoliosis, unspecified: Secondary | ICD-10-CM | POA: Insufficient documentation

## 2018-12-10 DIAGNOSIS — J45909 Unspecified asthma, uncomplicated: Secondary | ICD-10-CM | POA: Insufficient documentation

## 2018-12-10 DIAGNOSIS — R112 Nausea with vomiting, unspecified: Secondary | ICD-10-CM | POA: Diagnosis present

## 2018-12-10 LAB — CBC WITH DIFFERENTIAL/PLATELET
Abs Immature Granulocytes: 0.01 10*3/uL (ref 0.00–0.07)
BASOS ABS: 0 10*3/uL (ref 0.0–0.1)
Basophils Relative: 0 %
EOS PCT: 0 %
Eosinophils Absolute: 0 10*3/uL (ref 0.0–0.5)
HEMATOCRIT: 37.7 % (ref 36.0–46.0)
HEMOGLOBIN: 11.4 g/dL — AB (ref 12.0–15.0)
Immature Granulocytes: 0 %
LYMPHS ABS: 0.9 10*3/uL (ref 0.7–4.0)
LYMPHS PCT: 17 %
MCH: 26.5 pg (ref 26.0–34.0)
MCHC: 30.2 g/dL (ref 30.0–36.0)
MCV: 87.7 fL (ref 80.0–100.0)
MONO ABS: 0.3 10*3/uL (ref 0.1–1.0)
Monocytes Relative: 6 %
Neutro Abs: 4.1 10*3/uL (ref 1.7–7.7)
Neutrophils Relative %: 77 %
PLATELETS: 321 10*3/uL (ref 150–400)
RBC: 4.3 MIL/uL (ref 3.87–5.11)
RDW: 13.8 % (ref 11.5–15.5)
WBC: 5.3 10*3/uL (ref 4.0–10.5)
nRBC: 0 % (ref 0.0–0.2)

## 2018-12-10 LAB — COMPREHENSIVE METABOLIC PANEL
ALT: 20 U/L (ref 0–44)
AST: 21 U/L (ref 15–41)
Albumin: 4 g/dL (ref 3.5–5.0)
Alkaline Phosphatase: 107 U/L (ref 38–126)
Anion gap: 9 (ref 5–15)
BUN: 11 mg/dL (ref 6–20)
CHLORIDE: 107 mmol/L (ref 98–111)
CO2: 21 mmol/L — ABNORMAL LOW (ref 22–32)
CREATININE: 0.67 mg/dL (ref 0.44–1.00)
Calcium: 9.3 mg/dL (ref 8.9–10.3)
Glucose, Bld: 105 mg/dL — ABNORMAL HIGH (ref 70–99)
POTASSIUM: 3.8 mmol/L (ref 3.5–5.1)
Sodium: 137 mmol/L (ref 135–145)
Total Bilirubin: 0.4 mg/dL (ref 0.3–1.2)
Total Protein: 7.7 g/dL (ref 6.5–8.1)

## 2018-12-10 LAB — POC URINE PREG, ED: PREG TEST UR: NEGATIVE

## 2018-12-10 MED ORDER — ONDANSETRON HCL 4 MG PO TABS: 4.0000 mg | ORAL_TABLET | Freq: Three times a day (TID) | ORAL | 0 refills | Status: DC | PRN

## 2018-12-10 MED ORDER — ONDANSETRON 4 MG PO TBDP
4.0000 mg | ORAL_TABLET | Freq: Once | ORAL | Status: AC
  Administered 2018-12-10: 4 mg via ORAL
  Filled 2018-12-10: qty 1

## 2018-12-10 MED ORDER — ACETAMINOPHEN 325 MG PO TABS
650.0000 mg | ORAL_TABLET | Freq: Once | ORAL | Status: AC
  Administered 2018-12-10: 650 mg via ORAL
  Filled 2018-12-10: qty 2

## 2018-12-10 NOTE — ED Triage Notes (Signed)
Pt reports vomiting since yesterday around 1500. Pt denies urinary symptoms, fever and chills.

## 2018-12-10 NOTE — ED Notes (Signed)
Patient verbalizes understanding of discharge instructions. Opportunity for questioning and answers were provided. Armband removed by staff, pt discharged from ED.  

## 2018-12-10 NOTE — ED Provider Notes (Signed)
[facility] EMERGENCY DEPARTMENT Provider Note   CSN: [id number] Arrival date & time: 12/10/18  1049     History   Chief Complaint Chief Complaint  Patient presents with  . Emesis    HPI Makayla White is a 23 y.o. female.  HPI   Pt is a 23 y/o female with a h/o asthma, who presents to the ED today c/o nausea and vomiting that began yesterday. She vomited 5-6 times yesterday and 2 times today.   Denies abd pain, rhinorrhea, congestion, sore throat, diarrhea, fevers, dysuria, frequency. She also c/o a bilat frontal headache. Pain rated 6/10. Denies vision changes, dizziness, unilateral numbness/weakness, or recent head trauma.   Had spaghetti, meatballs, and chicken yesterday. She thinks the meatballs were undercooked. [place] not been out of the [place] recently.   Past Medical History:  Diagnosis Date  . Asthma   . Scoliosis     Patient Active Problem List   Diagnosis Date Noted  . Precipitous delivery 08/04/2018  . Supervision of high risk pregnancy, antepartum, third trimester 07/26/2018  . Small for gestational age (SGA) 07/26/2018    Past Surgical History:  Procedure Laterality Date  . NOSE SURGERY    . TONSILLECTOMY       OB History    Gravida  2   Para  2   Term  2   Preterm      AB      Living  2     SAB      TAB      Ectopic      Multiple  0   Live Births  2            Home Medications    Prior to Admission medications   Medication Sig Start Date End Date Taking? Authorizing Provider  ibuprofen (ADVIL,MOTRIN) 600 MG tablet Take 1 tablet (600 mg total) by mouth every 6 (six) hours as needed for moderate pain or cramping. 08/06/18   Anyanwu, Jethro Bastos, MD  ondansetron (ZOFRAN) 4 MG tablet Take 1 tablet (4 mg total) by mouth every 8 (eight) hours as needed for nausea or vomiting. 12/10/18   Hagan Vanauken S, PA-C  Prenatal MV-Min-Fe Fum-FA-DHA (PRENATAL 1 PO) Take by mouth.    [provider]     Family History No family history on file.  Social History Social History   Tobacco Use  . Smoking status: Never Smoker  . Smokeless tobacco: Never Used  Substance Use Topics  . Alcohol use: No  . Drug use: No     Allergies   Patient has no known allergies.   Review of Systems Review of Systems  Constitutional: Negative for fever.  HENT: Negative for congestion.   Eyes: Negative for visual disturbance.  Respiratory: Negative for shortness of breath.   Cardiovascular: Negative for chest pain.  Gastrointestinal: Positive for nausea and vomiting. Negative for abdominal pain, blood in stool, constipation and diarrhea.  Genitourinary: Negative for dysuria, frequency and pelvic pain.  Musculoskeletal: Negative for back pain.  Skin: Negative for rash.  Neurological: Positive for headaches. Negative for dizziness, weakness and numbness.     Physical Exam Updated Vital Signs BP 120/68   Pulse 84   Temp 97.9 F (36.6 C) (Oral)   Resp 15   LMP 11/08/2018 (Approximate)   SpO2 100%   Physical Exam Vitals signs and nursing note reviewed.  Constitutional:      General: She is not in acute distress.  Appearance: She is well-developed.  HENT:     Head: Normocephalic and atraumatic.     Mouth/Throat:     Mouth: Mucous membranes are moist.     Comments: Lips are dry/cracked Eyes:     Conjunctiva/sclera: Conjunctivae normal.  Neck:     Musculoskeletal: Neck supple.  Cardiovascular:     Rate and Rhythm: Normal rate and regular rhythm.     Heart sounds: Normal heart sounds. No murmur.  Pulmonary:     Effort: Pulmonary effort is normal. No respiratory distress.     Breath sounds: Normal breath sounds. No stridor. No wheezing or rhonchi.  Abdominal:     General: Bowel sounds are normal. There is no distension.     Palpations: Abdomen is soft.     Tenderness: There is no abdominal tenderness. There is no right CVA tenderness, left CVA tenderness, guarding or rebound.   Skin:    General: Skin is warm and dry.  Neurological:     General: No focal deficit present.     Mental Status: She is alert and oriented to person, place, and time.     Cranial Nerves: No cranial nerve deficit.     Coordination: Coordination normal.  Psychiatric:        Mood and Affect: Mood normal.      ED Treatments / Results  Labs (all labs ordered are listed, but only abnormal results are displayed) Labs Reviewed  CBC WITH DIFFERENTIAL/PLATELET - Abnormal; Notable for the following components:      Result Value   Hemoglobin 11.4 (*)    All other components within normal limits  COMPREHENSIVE METABOLIC PANEL - Abnormal; Notable for the following components:   CO2 21 (*)    Glucose, Bld 105 (*)    All other components within normal limits  POC URINE PREG, ED    EKG None  Radiology No results found.  Procedures Procedures (including critical care time)  Medications Ordered in ED Medications  ondansetron (ZOFRAN-ODT) disintegrating tablet 4 mg (4 mg Oral Given 12/10/18 1123)  acetaminophen (TYLENOL) tablet 650 mg (650 mg Oral Given 12/10/18 1124)     Initial Impression / Assessment and Plan / ED Course  I have reviewed the triage vital signs and the nursing notes.  Pertinent labs & imaging results that were available during my care of the patient were reviewed by me and considered in my medical decision making (see chart for details).     Final Clinical Impressions(s) / ED Diagnoses   Final diagnoses:  Non-intractable vomiting with nausea, unspecified vomiting type   Patient presenting to the emergency department today for evaluation of nausea and vomiting that began yesterday.  She thinks she ate some undercooked meatballs yesterday.  She is not febrile, her vital signs are normal.  Her abdomen is soft and nontender.  She has no urinary symptoms.  CBC without leukocytosis, anemia present and stable CMP with normal electrolytes, liver and kidney  function. U preg negative  On re-eval pt states her nausea has improved. She has tolerated PO. Abd remains soft and nontender. States that her headache has improved after fluids.  I suspect this patient has a viral syndrome.  She does not appear to have any acute intra-abdominal pathology at this time that would require further work-up or imaging at this time.  Will give Rx for antiemetics for home and have her follow-up with her PCP.  I have advised her to return to the ER for any new or worsening symptoms  in the meantime.  She voices understanding of the plan and reasons return the ED.  All questions answered.  ED Discharge Orders         Ordered    ondansetron (ZOFRAN) 4 MG tablet  Every 8 hours PRN     12/10/18 1212           Antionette Luster S, PA-C 12/10/18 1221    Derwood Kaplan, MD 12/10/18 1419

## 2018-12-10 NOTE — Discharge Instructions (Signed)
Please take medications as prescribed.  ° °Please follow up with your primary doctor within the next 5-7 days.  If you do not have a primary care provider, information for a healthcare clinic has been provided for you to make arrangements for follow up care. Please return to the ER sooner if you have any new or worsening symptoms, or if you have any of the following symptoms: ° °Abdominal pain that does not go away.  °You have a fever.  °You keep throwing up (vomiting).  °The pain is felt only in portions of the abdomen. Pain in the right side could possibly be appendicitis. In an adult, pain in the left lower portion of the abdomen could be colitis or diverticulitis.  °You pass bloody or black tarry stools.  °There is bright red blood in the stool.  °The constipation stays for more than 4 days.  °There is belly (abdominal) or rectal pain.  °You do not seem to be getting better.  °You have any questions or concerns.  ° °

## 2019-05-15 ENCOUNTER — Emergency Department (HOSPITAL_COMMUNITY)
Admission: EM | Admit: 2019-05-15 | Discharge: 2019-05-17 | Disposition: A | Discharge status: Home / Self Care | Payer: Medicaid Other | Attending: Emergency Medicine | Admitting: Emergency Medicine

## 2019-05-15 ENCOUNTER — Other Ambulatory Visit: Payer: Self-pay

## 2019-05-15 ENCOUNTER — Encounter (HOSPITAL_COMMUNITY): Payer: Self-pay | Admitting: Emergency Medicine

## 2019-05-15 DIAGNOSIS — Z3200 Encounter for pregnancy test, result unknown: Secondary | ICD-10-CM | POA: Diagnosis not present

## 2019-05-15 DIAGNOSIS — Z5321 Procedure and treatment not carried out due to patient leaving prior to being seen by health care provider: Secondary | ICD-10-CM | POA: Insufficient documentation

## 2019-05-15 DIAGNOSIS — R51 Headache: Secondary | ICD-10-CM | POA: Diagnosis not present

## 2019-05-15 LAB — POC URINE PREG, ED: Preg Test, Ur: NEGATIVE

## 2019-05-15 NOTE — ED Triage Notes (Addendum)
States need preg tests last period was in June  12 last 3 days had had unprotected intercourse,  P3 G 2 A 0  L 2 states has  A h/a also which is what she gets when she is preg

## 2019-09-19 ENCOUNTER — Ambulatory Visit (HOSPITAL_COMMUNITY)
Admission: EM | Admit: 2019-09-19 | Discharge: 2019-09-19 | Disposition: A | Discharge status: Home / Self Care | Payer: Medicaid Other | Attending: Emergency Medicine | Admitting: Emergency Medicine

## 2019-09-19 ENCOUNTER — Encounter (HOSPITAL_COMMUNITY): Payer: Self-pay

## 2019-09-19 ENCOUNTER — Other Ambulatory Visit: Payer: Self-pay

## 2019-09-19 DIAGNOSIS — Z3202 Encounter for pregnancy test, result negative: Secondary | ICD-10-CM

## 2019-09-19 LAB — POCT PREGNANCY, URINE: Preg Test, Ur: NEGATIVE

## 2019-09-19 NOTE — Discharge Instructions (Addendum)
Your urine pregnancy test was negative here.  Follow-up with either the health department or with a primary care physician of your choice for routine care.  See list below  Below is a list of primary care practices who are taking new patients for you to follow-up with.  [facility] at [facility] [address] [place], [place] [address] [phone]  [place], [place] [address] [phone]  [facility] [phone] [place] [address]  [facility]: [place]  [phone]  [place] [facility]: [place]   [phone]  [facility]: [address] [place]  [phone]  [place] primary [facility] : [address] [phone]  [facility]: [address] [place] [address] [phone]  [facility]: [place] [phone]  Dr. Blanchie Serve [place]  [phone]  Dr. Benito Mccreedy, [facility]. [place], [place] [address]  [phone]  Go to www.goodrx.com to look up your medications. This will give you a list of where you can find your prescriptions at the most affordable prices. Or ask the pharmacist what the cash price is, or if they have any other discount programs available to help make your medication more affordable. This can be less expensive than what you would pay with insurance.

## 2019-09-19 NOTE — ED Provider Notes (Signed)
HPI  SUBJECTIVE:  Makayla White is a G78P2  23 y.o. female who presents for pregnancy testing.  States that she had 2 menses this month.  She is having unprotected sex with a female partner.  She states that she is not trying to get pregnant.  She did not take any pregnancy tests at home.  She has no complaints.  Past medical history of asthma.  PMD: None.    Past Medical History:  Diagnosis Date  . Asthma   . Scoliosis     Past Surgical History:  Procedure Laterality Date  . NOSE SURGERY    . TONSILLECTOMY      History reviewed. No pertinent family history.  Social History   Tobacco Use  . Smoking status: Never Smoker  . Smokeless tobacco: Never Used  Substance Use Topics  . Alcohol use: No  . Drug use: No    No current facility-administered medications for this encounter.   Current Outpatient Medications:  .  ibuprofen (ADVIL,MOTRIN) 600 MG tablet, Take 1 tablet (600 mg total) by mouth every 6 (six) hours as needed for moderate pain or cramping., Disp: 40 tablet, Rfl: 2 .  ondansetron (ZOFRAN) 4 MG tablet, Take 1 tablet (4 mg total) by mouth every 8 (eight) hours as needed for nausea or vomiting., Disp: 8 tablet, Rfl: 0 .  Prenatal MV-Min-Fe Fum-FA-DHA (PRENATAL 1 PO), Take by mouth., Disp: , Rfl:   No Known Allergies   ROS  As noted in HPI.   Physical Exam  BP 133/72 (BP Location: Right Arm)   Pulse 78   Temp 97.7 F (36.5 C) (Oral)   Resp 16   Wt 65.8 kg   LMP 09/03/2019   SpO2 100%   BMI 24.13 kg/m   Constitutional: Well developed, well nourished, no acute distress Eyes:  EOMI, conjunctiva normal bilaterally HENT: Normocephalic, atraumatic,mucus membranes moist Respiratory: Normal inspiratory effort Cardiovascular: Normal rate GI: nondistended skin: No rash, skin intact Musculoskeletal: no deformities Neurologic: Alert & oriented x 3, no focal neuro deficits Psychiatric: Speech and behavior appropriate   ED Course   Medications - No  data to display  Orders Placed This Encounter  Procedures  . POC urine pregnancy    Standing Status:   Standing    Number of Occurrences:   1  . Pregnancy, urine POC    Standing Status:   Standing    Number of Occurrences:   1    Results for orders placed or performed during the hospital encounter of 09/19/19 (from the past 24 hour(s))  Pregnancy, urine POC     Status: None   Collection Time: 09/19/19  1:28 PM  Result Value Ref Range   Preg Test, Ur NEGATIVE NEGATIVE   No results found.  ED Clinical Impression  1. Urine pregnancy test negative      ED Assessment/Plan Urine pregnancy negative.  Follow-up with primary care physician of choice, will provide primary care list.  No orders of the defined types were placed in this encounter.   *This clinic note was created using Dragon dictation software. Therefore, there may be occasional mistakes despite careful proofreading.   ?    Melynda Ripple, MD 09/19/19 1420

## 2019-09-25 ENCOUNTER — Other Ambulatory Visit: Payer: Self-pay | Admitting: Obstetrics and Gynecology

## 2019-09-25 DIAGNOSIS — O365930 Maternal care for other known or suspected poor fetal growth, third trimester, not applicable or unspecified: Secondary | ICD-10-CM

## 2019-09-25 DIAGNOSIS — O0993 Supervision of high risk pregnancy, unspecified, third trimester: Secondary | ICD-10-CM

## 2019-10-04 IMAGING — US US MFM OB FOLLOW-UP
1 series · 12 of 28 positions shown · non-contrast
Comparison: none

[Series 1: us mfm ob follow-up · 51 acquisitions, 12 frames shown]
[im 2/51]
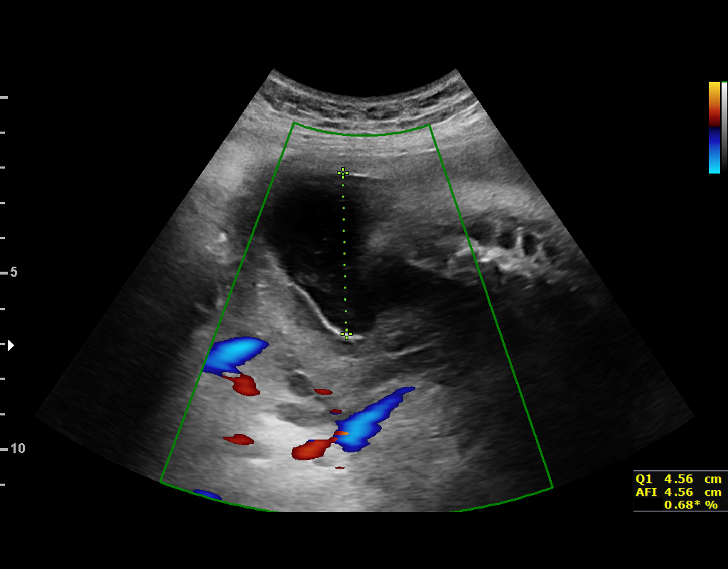
[im 6/51]
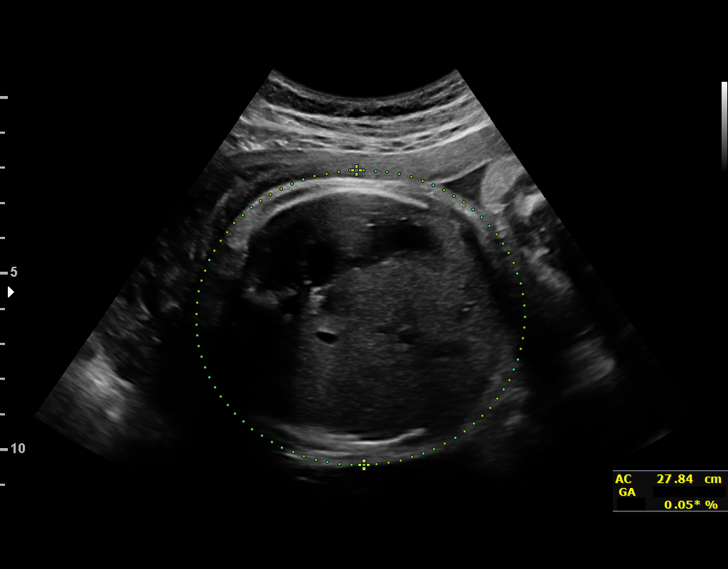
[im 10/51]
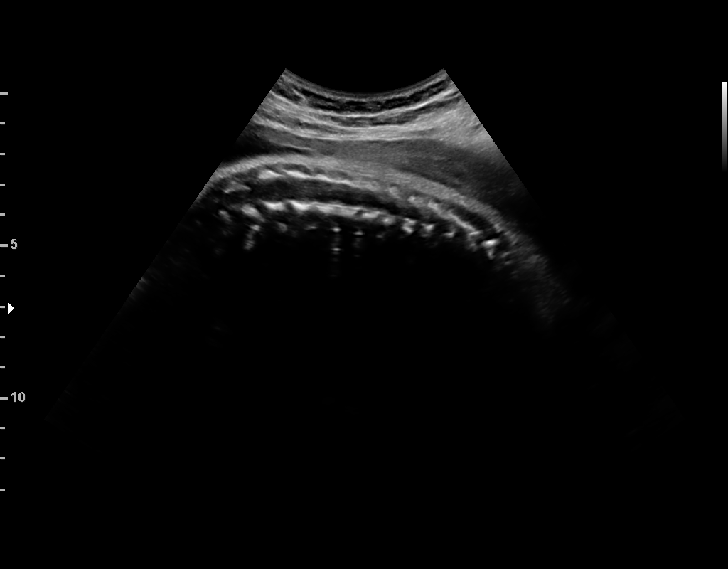
[im 15/51]
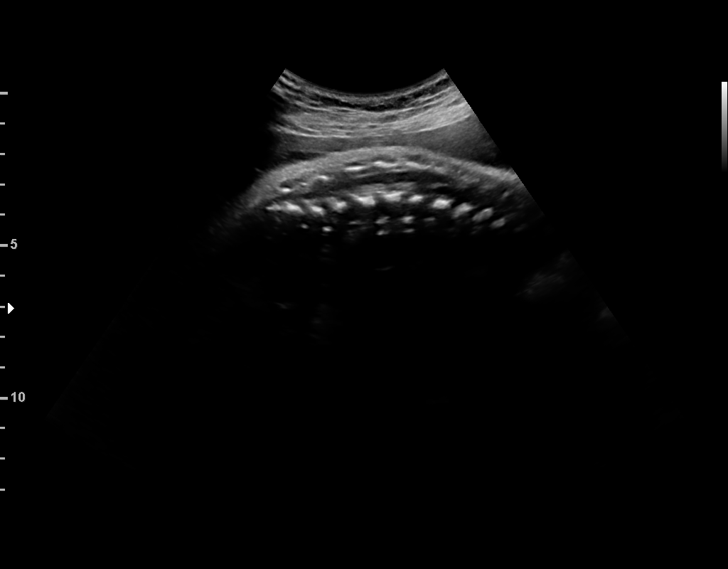
[im 19/51]
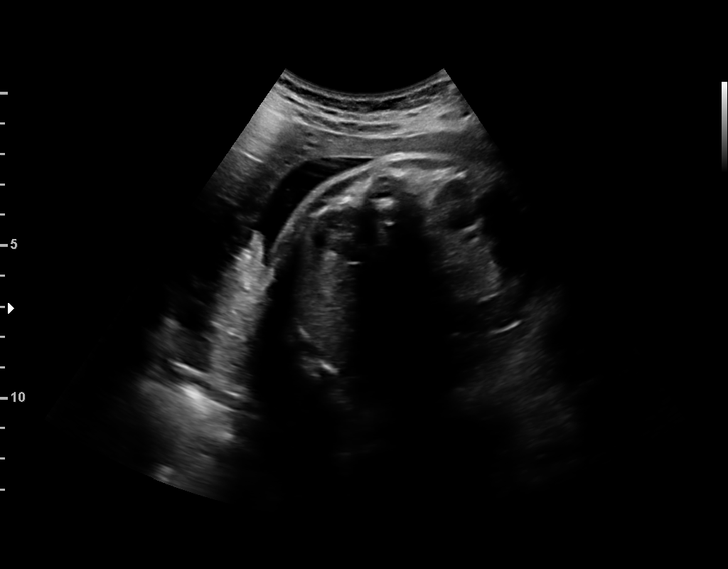
[im 23/51]
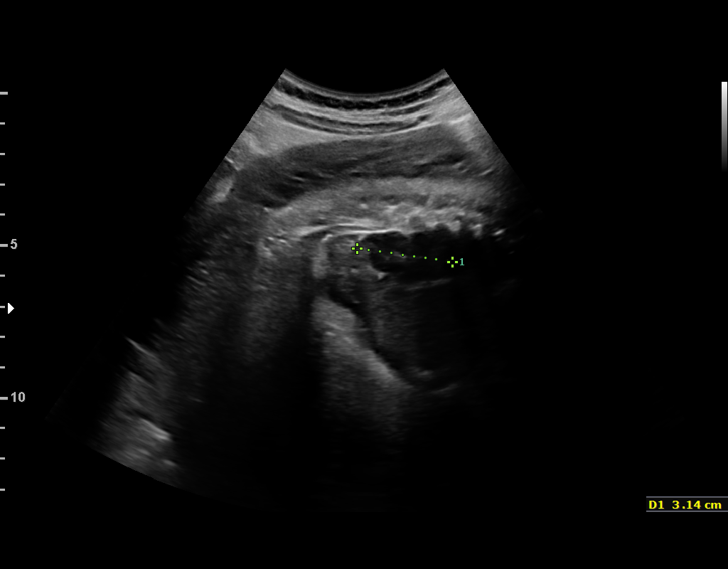
[im 28/51]
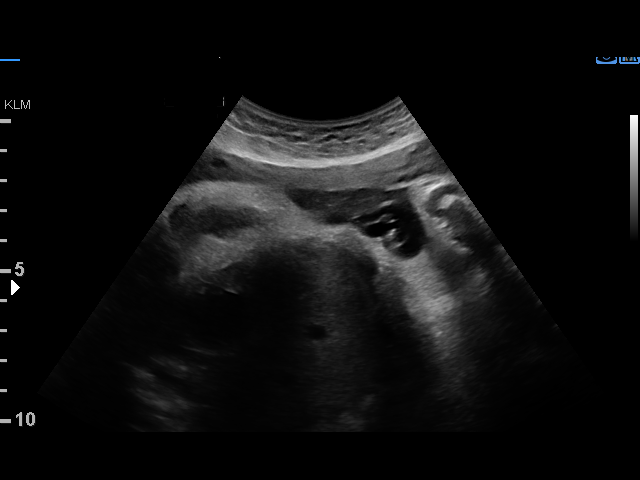
[im 32/51]
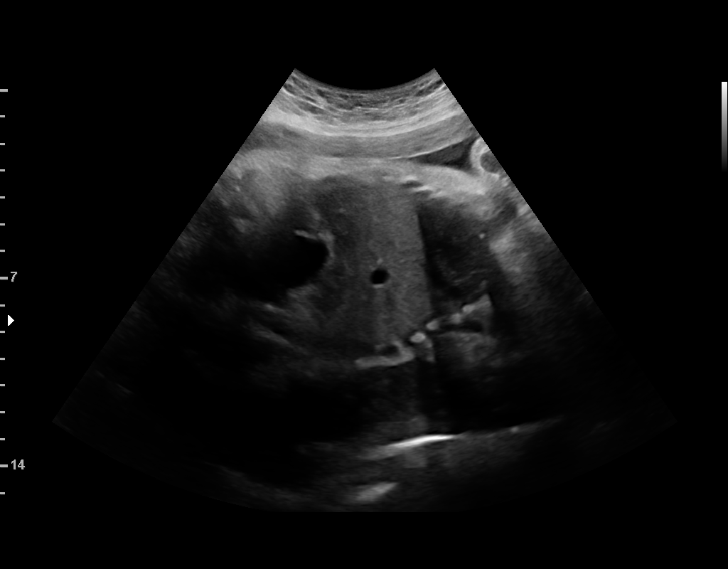
[im 36/51]
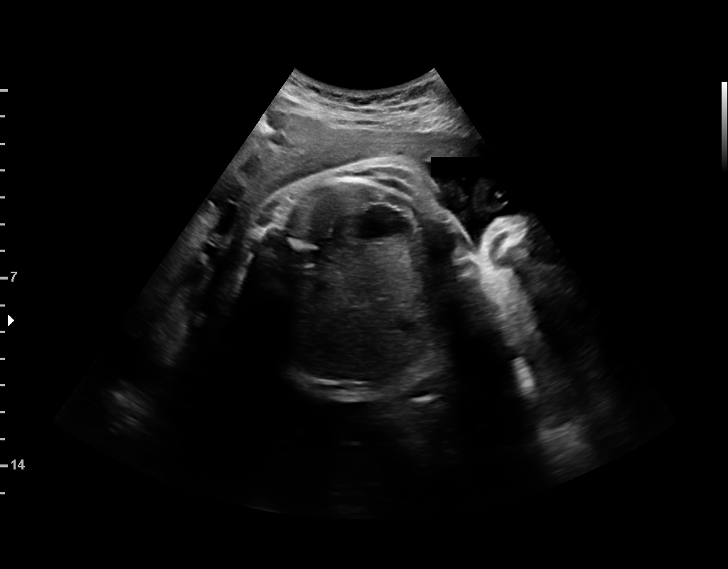
[im 41/51]
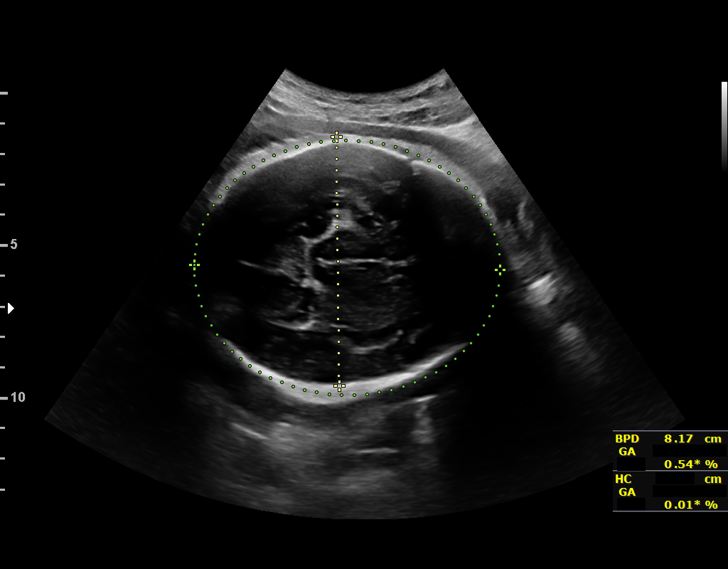
[im 45/51]
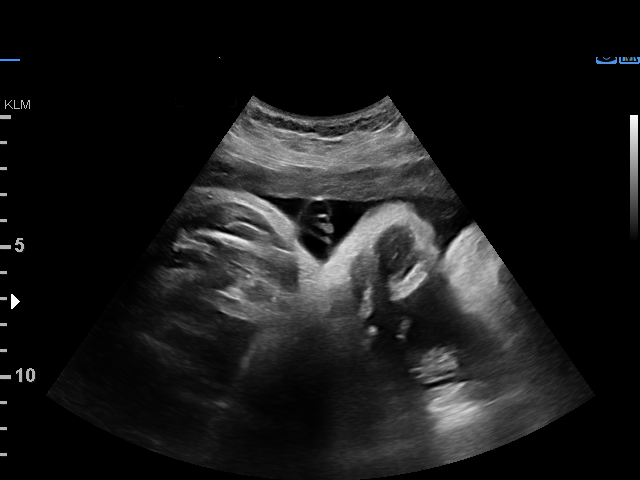
[im 49/51]
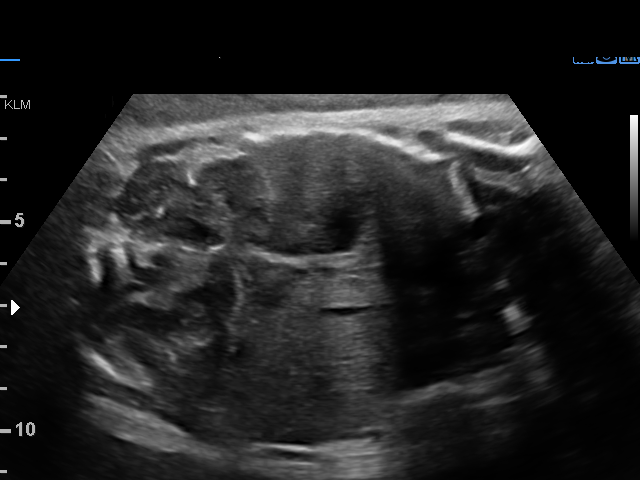

[12 of 28 positions shown; findings below may reference images not displayed]

MUCKLE

[REDACTED]-
Faculty Physician

2  US MFM UA CORD DOPPLER               76820.02     DIMITRI NAQVI

Indications

Small for gestational age fetus affecting
management of mother
36 weeks gestation of pregnancy
Vital Signs

Height:        5'5"
Fetal Evaluation

Num Of Fetuses:         1
Fetal Heart Rate(bpm):  142
Cardiac Activity:       Observed
Presentation:           Cephalic
Placenta:               Posterior
P. Cord Insertion:      Previously Visualized

Amniotic Fluid
AFI FV:      Within normal limits

AFI Sum(cm)     %Tile       Largest Pocket(cm)
14.2            52

RUQ(cm)       RLQ(cm)       LUQ(cm)        LLQ(cm)
4.56
Biophysical Evaluation

Amniotic F.V:   Within normal limits       F. Tone:        Observed
F. Movement:    Observed                   Score:          [DATE]
F. Breathing:   Observed
Biometry

BPD:      80.9  mm     G. Age:  32w 3d        < 1  %    CI:        77.08   %    70 - 86
FL/HC:      22.0   %    20.8 -
HC:      291.8  mm     G. Age:  32w 1d        < 3  %    HC/AC:      1.04        0.92 -
AC:      281.9  mm     G. Age:  32w 1d        < 3  %    FL/BPD:     79.2   %    71 - 87
FL:       64.1  mm     G. Age:  33w 1d        < 3  %    FL/AC:      22.7   %    20 - 24

Est. FW:    3090  gm      4 lb 6 oz   < 10  %
OB History

Gravidity:    2         Term:   1
Living:       1
Gestational Age

LMP:           37w 5d        Date:  11/05/17                 EDD:   08/12/18
U/S Today:     32w 3d                                        EDD:   09/18/18
Best:          36w 4d     Det. By:  Early Ultrasound         EDD:   08/20/18
Anatomy

Cranium:               Appears normal         Aortic Arch:            Previously seen
Cavum:                 Appears normal         Ductal Arch:            Previously seen
Ventricles:            Appears normal         Diaphragm:              Previously seen
Choroid Plexus:        Previously seen        Stomach:                Appears normal, left
sided
Cerebellum:            Previously seen        Abdomen:                Appears normal
Posterior Fossa:       Previously seen        Abdominal Wall:         Not well visualized
Nuchal Fold:           Not applicable (>20    Cord Vessels:           Previously seen
wks GA)
Face:                  Orbits and profile     Kidneys:                Appear normal
previously seen
Lips:                  Previously seen        Bladder:                Appears normal
Thoracic:              Appears normal         Spine:                  Appears normal
Heart:                 Appears normal         Upper Extremities:      Previously seen
(4CH, axis, and situs
RVOT:                  Previously seen        Lower Extremities:      Previously seen
LVOT:                  Appears normal

Other:  Female gender. Heels and 5th digit previously visualized. Technically
difficult due to fetal position.
Doppler - Fetal Vessels

Umbilical Artery
S/D     %tile                                            ADFV    RDFV
2.03       28                                                No      No

Comments

U/S images reviewed. Findings reviewed with patient.
Decreased fetal growth is seen at less than the 10%.
Cardiomegaly and a pericardial effusion are seen  No other
fetal abnormalities are seen.  No evidence of fetal
compromise is found on BPP today.
Questions answered.
10 minutes spent face to face with patient.
Recommendations: 1) Fetal ECHO with Pediatric Cardiology
2) Weekly BPP and UA dopplers  3) Serial U/S every 2-3
weeks for fetal growth  4) Delivery @ 39 weeks; deliver
earlier if oligohydramnios, poor fetal testing or abnormal
doppler
Recommendations

1) Fetal ECHO with Pediatric Cardiology 2) Weekly BPP and
UA dopplers  3) Serial U/S every 2-3 weeks for fetal growth
4) Delivery @ 39 weeks; deliver earlier if oligohydramnios,
poor fetal testing or abnormal doppler

## 2019-10-16 ENCOUNTER — Emergency Department (HOSPITAL_COMMUNITY)
Admission: EM | Admit: 2019-10-16 | Discharge: 2019-10-16 | Disposition: A | Discharge status: Home / Self Care | Payer: Medicaid Other | Attending: Emergency Medicine | Admitting: Emergency Medicine

## 2019-10-16 ENCOUNTER — Other Ambulatory Visit: Payer: Self-pay

## 2019-10-16 ENCOUNTER — Emergency Department (HOSPITAL_COMMUNITY): Payer: Medicaid Other

## 2019-10-16 ENCOUNTER — Encounter (HOSPITAL_COMMUNITY): Payer: Self-pay | Admitting: Emergency Medicine

## 2019-10-16 DIAGNOSIS — Z3A01 Less than 8 weeks gestation of pregnancy: Secondary | ICD-10-CM | POA: Diagnosis not present

## 2019-10-16 DIAGNOSIS — O99511 Diseases of the respiratory system complicating pregnancy, first trimester: Secondary | ICD-10-CM | POA: Insufficient documentation

## 2019-10-16 DIAGNOSIS — O210 Mild hyperemesis gravidarum: Secondary | ICD-10-CM | POA: Insufficient documentation

## 2019-10-16 DIAGNOSIS — J45909 Unspecified asthma, uncomplicated: Secondary | ICD-10-CM | POA: Diagnosis not present

## 2019-10-16 DIAGNOSIS — Z79899 Other long term (current) drug therapy: Secondary | ICD-10-CM | POA: Diagnosis not present

## 2019-10-16 LAB — URINALYSIS, ROUTINE W REFLEX MICROSCOPIC
Bilirubin Urine: NEGATIVE
Glucose, UA: NEGATIVE mg/dL
Hgb urine dipstick: NEGATIVE
Ketones, ur: NEGATIVE mg/dL
Leukocytes,Ua: NEGATIVE
Nitrite: NEGATIVE
Protein, ur: NEGATIVE mg/dL
Specific Gravity, Urine: 1.028 (ref 1.005–1.030)
pH: 6 (ref 5.0–8.0)

## 2019-10-16 LAB — COMPREHENSIVE METABOLIC PANEL
ALT: 10 U/L (ref 0–44)
AST: 17 U/L (ref 15–41)
Albumin: 3.7 g/dL (ref 3.5–5.0)
Alkaline Phosphatase: 93 U/L (ref 38–126)
Anion gap: 7 (ref 5–15)
BUN: 7 mg/dL (ref 6–20)
CO2: 23 mmol/L (ref 22–32)
Calcium: 9.2 mg/dL (ref 8.9–10.3)
Chloride: 108 mmol/L (ref 98–111)
Creatinine, Ser: 0.67 mg/dL (ref 0.44–1.00)
GFR calc Af Amer: >60 mL/min (ref >60)
GFR calc non Af Amer: >60 mL/min (ref >60)
Glucose, Bld: 119 mg/dL — ABNORMAL HIGH (ref 70–99)
Potassium: 3.9 mmol/L (ref 3.5–5.1)
Sodium: 138 mmol/L (ref 135–145)
Total Bilirubin: 0.3 mg/dL (ref 0.3–1.2)
Total Protein: 7.4 g/dL (ref 6.5–8.1)

## 2019-10-16 LAB — CBC
HCT: 33.8 % — ABNORMAL LOW (ref 36.0–46.0)
Hemoglobin: 10.5 g/dL — ABNORMAL LOW (ref 12.0–15.0)
MCH: 26.8 pg (ref 26.0–34.0)
MCHC: 31.1 g/dL (ref 30.0–36.0)
MCV: 86.2 fL (ref 80.0–100.0)
Platelets: 388 10*3/uL (ref 150–400)
RBC: 3.92 MIL/uL (ref 3.87–5.11)
RDW: 15.7 % — ABNORMAL HIGH (ref 11.5–15.5)
WBC: 8.5 10*3/uL (ref 4.0–10.5)
nRBC: 0 % (ref 0.0–0.2)

## 2019-10-16 LAB — LIPASE, BLOOD: Lipase: 26 U/L (ref 11–51)

## 2019-10-16 LAB — HCG, QUANTITATIVE, PREGNANCY: hCG, Beta Chain, Quant, S: 48170 m[IU]/mL — ABNORMAL HIGH (ref <5)

## 2019-10-16 LAB — WET PREP, GENITAL
Clue Cells Wet Prep HPF POC: NONE SEEN
Sperm: NONE SEEN
Trich, Wet Prep: NONE SEEN
Yeast Wet Prep HPF POC: NONE SEEN

## 2019-10-16 LAB — I-STAT BETA HCG BLOOD, ED (MC, WL, AP ONLY): I-stat hCG, quantitative: >2000 m[IU]/mL — ABNORMAL HIGH (ref <5)

## 2019-10-16 MED ORDER — METOCLOPRAMIDE HCL 10 MG PO TABS
10.0000 mg | ORAL_TABLET | Freq: Once | ORAL | Status: AC
  Administered 2019-10-16: 12:00:00 10 mg via ORAL
  Filled 2019-10-16: qty 1

## 2019-10-16 MED ORDER — METOCLOPRAMIDE HCL 5 MG/ML IJ SOLN: 10.0000 mg | Freq: Once | INTRAMUSCULAR | Status: DC

## 2019-10-16 MED ORDER — SODIUM CHLORIDE 0.9 % IV BOLUS: 1000.0000 mL | Freq: Once | INTRAVENOUS | Status: DC

## 2019-10-16 MED ORDER — METOCLOPRAMIDE HCL 10 MG PO TABS: 10.0000 mg | ORAL_TABLET | Freq: Four times a day (QID) | ORAL | 0 refills | Status: DC

## 2019-10-16 NOTE — ED Provider Notes (Signed)
[facility] Provider Note   CSN: [id number] Arrival date & time: 10/16/19  [id number]     History   Chief Complaint Chief Complaint  Patient presents with  . Emesis    HPI Gerald StabsShanisha White is a 23 y.o. G2 P2 female who presents with nausea and vomiting.  Patient states that for the past 3 to 4 days she has had persistent intractable nausea and vomiting.  She cannot keep anything down and feels weak and dehydrated.  She had 2 menstrual cycles last month. The initial one was from the 3-7th and then from 10th to the 14th. Both were shorter than normal.  [facility] which was negative on October 27.  She states that she has had nausea and vomiting with prior pregnancies but is never been this bad.  She has been having some suprapubic abdominal pain which is mild and comes and goes.  Nothing makes it better or worse.  She was unsure what to take over-the-counter so she decided to come to the ED.  No fever, chills, current abdominal pain, pelvic pain, urinary symptoms, vaginal discharge or bleeding, diarrhea or constipation.    HPI  Past Medical History:  Diagnosis Date  . Asthma   . Scoliosis     Patient Active Problem List   Diagnosis Date Noted  . Precipitous delivery 08/04/2018  . Supervision of high risk pregnancy, antepartum, third trimester 07/26/2018  . Small for gestational age (SGA) 07/26/2018    Past Surgical History:  Procedure Laterality Date  . NOSE SURGERY    . TONSILLECTOMY       OB History    Gravida  2   Para  2   Term  2   Preterm      AB      Living  2     SAB      TAB      Ectopic      Multiple  0   Live Births  2            Home Medications    Prior to Admission medications   Medication Sig Start Date End Date Taking? Authorizing Provider  ibuprofen (ADVIL,MOTRIN) 600 MG tablet Take 1 tablet (600 mg total) by mouth every 6 (six) hours as needed for  moderate pain or cramping. 08/06/18   Anyanwu, Jethro BastosUgonna A, MD  ondansetron (ZOFRAN) 4 MG tablet Take 1 tablet (4 mg total) by mouth every 8 (eight) hours as needed for nausea or vomiting. 12/10/18   Couture, Cortni S, PA-C  Prenatal MV-Min-Fe Fum-FA-DHA (PRENATAL 1 PO) Take by mouth.    [provider]    Family History No family history on file.  Social History Social History   Tobacco Use  . Smoking status: Never Smoker  . Smokeless tobacco: Never Used  Substance Use Topics  . Alcohol use: No  . Drug use: No     Allergies   Patient has no known allergies.   Review of Systems Review of Systems  Constitutional: Negative for chills and fever.  Respiratory: Negative for shortness of breath.   Cardiovascular: Negative for chest pain.  Gastrointestinal: Positive for abdominal pain, nausea and vomiting. Negative for constipation and diarrhea.  Genitourinary: Positive for menstrual problem and pelvic pain. Negative for difficulty urinating, dysuria, flank pain, vaginal bleeding and vaginal discharge.  All other systems reviewed and are negative.    Physical Exam Updated Vital Signs BP  112/72 (BP Location: Left Arm)   Pulse 77   Temp 98 F (36.7 C) (Oral)   Resp 16   Ht  (1.651 m)   Wt 65.8 kg   LMP 09/11/2019   SpO2 100%   BMI 24.13 kg/m   Physical Exam Vitals signs and nursing note reviewed.  Constitutional:      General: She is not in acute distress.    Appearance: Normal appearance. She is well-developed. She is not ill-appearing.     Comments: Fatigue appearing. NAD  HENT:     Head: Normocephalic and atraumatic.  Eyes:     General: No scleral icterus.       Right eye: No discharge.        Left eye: No discharge.     Conjunctiva/sclera: Conjunctivae normal.     Pupils: Pupils are equal, round, and reactive to light.  Neck:     Musculoskeletal: Normal range of motion.  Cardiovascular:     Rate and Rhythm: Normal rate and regular rhythm.   Pulmonary:     Effort: Pulmonary effort is normal. No respiratory distress.     Breath sounds: Normal breath sounds.  Abdominal:     General: There is no distension.     Palpations: Abdomen is soft.     Tenderness: There is no abdominal tenderness.  Genitourinary:    Comments: Pelvic: No inguinal lymphadenopathy or inguinal hernia noted. Normal external genitalia. No pain with speculum insertion. Closed cervical os. There is slight friability of the cervix with a little bleeding. No significant discharge or bleeding noted from cervix or in vaginal vault. On bimanual examination no adnexal tenderness or cervical motion tenderness. Chaperone present during exam.  Skin:    General: Skin is warm and dry.  Neurological:     Mental Status: She is alert and oriented to person, place, and time.  Psychiatric:        Behavior: Behavior normal.      ED Treatments / Results  Labs (all labs ordered are listed, but only abnormal results are displayed) Labs Reviewed  WET PREP, GENITAL - Abnormal; Notable for the following components:      Result Value   WBC, Wet Prep HPF POC MODERATE (*)    All other components within normal limits  COMPREHENSIVE METABOLIC PANEL - Abnormal; Notable for the following components:   Glucose, Bld 119 (*)    All other components within normal limits  CBC - Abnormal; Notable for the following components:   Hemoglobin 10.5 (*)    HCT 33.8 (*)    RDW 15.7 (*)    All other components within normal limits  HCG, QUANTITATIVE, PREGNANCY - Abnormal; Notable for the following components:   hCG, Beta Chain, Quant, S 48,170 (*)    All other components within normal limits  I-STAT BETA HCG BLOOD, ED ([facility], [facility], [facility] ONLY) - Abnormal; Notable for the following components:   I-stat hCG, quantitative >2,000.0 (*)    All other components within normal limits  LIPASE, BLOOD  URINALYSIS, ROUTINE W REFLEX MICROSCOPIC  GC/CHLAMYDIA PROBE AMP ([place]) NOT AT [facility]    EKG None   Radiology US Ob Comp < 14 Wks  Result Date: 10/16/2019 CLINICAL DATA:  Pelvic pain EXAM: OBSTETRIC <14 WK [place] AND TRANSVAGINAL OB US TECHNIQUE: Both transabdominal and transvaginal ultrasound examinations were performed for complete evaluation of the gestation as well as the maternal uterus, adnexal regions, and pelvic cul-de-sac. Transvaginal technique was performed to assess early pregnancy.  COMPARISON:  None. FINDINGS: Intrauterine gestational sac: Visualized Yolk sac:  Visualized Embryo:  Visualized Cardiac Activity: Visualized Heart Rate: 152 bpm CRL:  3 mm   5 w   6 d                  [place] EDC: June 11, 2020 Subchorionic hemorrhage: There is a subchorionic hemorrhage measuring 1.2 x 0.8 cm. Maternal uterus/adnexae: Cervical os is closed. Right ovary measures 2.4 x 1.7 x 2.0 cm. Left ovary measures 1.7 x 1.1 x 2.0 cm. No extrauterine pelvic mass. There is trace free pelvic fluid. IMPRESSION: Single live intrauterine gestation with estimated gestational age of approximately 6 weeks. There is a subchorionic hemorrhage measuring 1.2 x 0.8 cm. Trace free pelvic fluid may be within physiologic range. Electronically Signed   By: Bretta Bang III M.D.   On: 10/16/2019 12:14   US Ob Transvaginal  Result Date: 10/16/2019 CLINICAL DATA:  Pelvic pain EXAM: OBSTETRIC <14 WK [place] AND TRANSVAGINAL OB US TECHNIQUE: Both transabdominal and transvaginal ultrasound examinations were performed for complete evaluation of the gestation as well as the maternal uterus, adnexal regions, and pelvic cul-de-sac. Transvaginal technique was performed to assess early pregnancy. COMPARISON:  None. FINDINGS: Intrauterine gestational sac: Visualized Yolk sac:  Visualized Embryo:  Visualized Cardiac Activity: Visualized Heart Rate: 152 bpm CRL:  3 mm   5 w   6 d                  [place] EDC: June 11, 2020 Subchorionic hemorrhage: There is a subchorionic hemorrhage measuring 1.2 x 0.8 cm. Maternal uterus/adnexae: Cervical os is closed.  Right ovary measures 2.4 x 1.7 x 2.0 cm. Left ovary measures 1.7 x 1.1 x 2.0 cm. No extrauterine pelvic mass. There is trace free pelvic fluid. IMPRESSION: Single live intrauterine gestation with estimated gestational age of approximately 6 weeks. There is a subchorionic hemorrhage measuring 1.2 x 0.8 cm. Trace free pelvic fluid may be within physiologic range. Electronically Signed   By: Bretta Bang III M.D.   On: 10/16/2019 12:14    Procedures Procedures (including critical care time)  Medications Ordered in ED Medications  metoCLOPramide (REGLAN) tablet 10 mg (10 mg Oral Given 10/16/19 1207)     Initial Impression / Assessment and Plan / ED Course  I have reviewed the triage vital signs and the nursing notes.  Pertinent labs & imaging results that were available during my care of the patient were reviewed by me and considered in my medical decision making (see chart for details).  23 year old female presents with intractable nausea and vomiting with intermittent suprapubic pain.  Her vital signs are normal.  On initial exam she is fatigued appearing.  Heart is regular rate and rhythm.  Lungs are clear to auscultation.  Abdomen is soft and nontender.  Pelvic exam was performed and shows slightly friable cervix but otherwise is unremarkable and there is no tenderness.  Pregnancy test is positive.  Quantitative hCG is 48,000.  CBC is remarkable for mild anemia.  CMP and lipase are normal.  UA is unremarkable.  Ultrasound was obtained and shows IUP that is approximately 6 weeks.  Patient was given Reglan which provided good relief of her nausea.  She tolerated p.o. here.  [facility].  She was advised to call the clinic to set up her first prenatal visit.  She is encouraged to start prenatal vitamins.  Prescription for Reglan was given. Advised return  if worsening  Final Clinical Impressions(s) / ED Diagnoses   Final diagnoses:  Hyperemesis of  pregnancy    ED Discharge Orders    None       Recardo Evangelist, PA-C 10/16/19 1442    Davonna Belling, MD 10/16/19 1527

## 2019-10-16 NOTE — Discharge Instructions (Signed)
Schedule a visit with OBGYN for prenatal care Please start prenatal vitamins  Take Reglan for nausea and vomiting Return if worsening

## 2019-10-16 NOTE — ED Notes (Signed)
Pt tearful and refusing IV/labs at this time. Notified PA

## 2019-10-16 NOTE — ED Triage Notes (Signed)
Pt reports nausea and vomiting for 3 days. Denies any sick contacts. Endorses some mild lower abd pain.

## 2019-10-16 NOTE — ED Notes (Signed)
Pt taken to Ultrasound.

## 2019-10-16 NOTE — ED Notes (Signed)
Pt eating [place] sandwich drinking ginger ale with no problem.

## 2019-10-17 LAB — GC/CHLAMYDIA PROBE AMP (~~LOC~~) NOT AT ARMC
Chlamydia: POSITIVE — AB
Neisseria Gonorrhea: NEGATIVE

## 2019-10-18 ENCOUNTER — Other Ambulatory Visit: Payer: Self-pay

## 2019-10-18 ENCOUNTER — Encounter (HOSPITAL_COMMUNITY): Payer: Self-pay | Admitting: *Deleted

## 2019-10-18 ENCOUNTER — Inpatient Hospital Stay (HOSPITAL_COMMUNITY)
Admission: AD | Admit: 2019-10-18 | Discharge: 2019-10-18 | Disposition: A | Discharge status: Home / Self Care | Payer: Medicaid Other | Attending: Obstetrics and Gynecology | Admitting: Obstetrics and Gynecology

## 2019-10-18 DIAGNOSIS — Z3A01 Less than 8 weeks gestation of pregnancy: Secondary | ICD-10-CM | POA: Insufficient documentation

## 2019-10-18 DIAGNOSIS — Z79899 Other long term (current) drug therapy: Secondary | ICD-10-CM | POA: Insufficient documentation

## 2019-10-18 DIAGNOSIS — O219 Vomiting of pregnancy, unspecified: Secondary | ICD-10-CM | POA: Diagnosis present

## 2019-10-18 DIAGNOSIS — O99891 Other specified diseases and conditions complicating pregnancy: Secondary | ICD-10-CM | POA: Diagnosis not present

## 2019-10-18 DIAGNOSIS — J45909 Unspecified asthma, uncomplicated: Secondary | ICD-10-CM | POA: Insufficient documentation

## 2019-10-18 DIAGNOSIS — O99511 Diseases of the respiratory system complicating pregnancy, first trimester: Secondary | ICD-10-CM | POA: Insufficient documentation

## 2019-10-18 DIAGNOSIS — M419 Scoliosis, unspecified: Secondary | ICD-10-CM | POA: Insufficient documentation

## 2019-10-18 HISTORY — DX: Chlamydial infection, unspecified: A74.9

## 2019-10-18 HISTORY — DX: Gonococcal infection, unspecified: A54.9

## 2019-10-18 LAB — URINALYSIS, ROUTINE W REFLEX MICROSCOPIC
Bacteria, UA: NONE SEEN
Bilirubin Urine: NEGATIVE
Glucose, UA: NEGATIVE mg/dL
Hgb urine dipstick: NEGATIVE
Ketones, ur: 5 mg/dL — AB
Leukocytes,Ua: NEGATIVE
Nitrite: NEGATIVE
Protein, ur: 30 mg/dL — AB
Specific Gravity, Urine: 1.026 (ref 1.005–1.030)
pH: 6 (ref 5.0–8.0)

## 2019-10-18 MED ORDER — PROMETHAZINE HCL 25 MG PO TABS: 25.0000 mg | ORAL_TABLET | Freq: Four times a day (QID) | ORAL | 0 refills | Status: DC | PRN

## 2019-10-18 MED ORDER — DOXYLAMINE-PYRIDOXINE 10-10 MG PO TBEC: DELAYED_RELEASE_TABLET | ORAL | 0 refills | Status: DC

## 2019-10-18 MED ORDER — PROMETHAZINE HCL 25 MG PO TABS
25.0000 mg | ORAL_TABLET | Freq: Once | ORAL | Status: AC
  Administered 2019-10-18: 25 mg via ORAL
  Filled 2019-10-18: qty 1

## 2019-10-18 NOTE — Discharge Instructions (Signed)
Morning Sickness  Morning sickness is when a woman feels nauseous during pregnancy. This nauseous feeling may or may not come with vomiting. It often occurs in the morning, but it can be a problem at any time of day. Morning sickness is most common during the first trimester. In some cases, it may continue throughout pregnancy. Although morning sickness is unpleasant, it is usually harmless unless the woman develops severe and continual vomiting (hyperemesis gravidarum), a condition that requires more intense treatment. What are the causes? The exact cause of this condition is not known, but it seems to be related to normal hormonal changes that occur in pregnancy. What increases the risk? You are more likely to develop this condition if:  You experienced nausea or vomiting before your pregnancy.  You had morning sickness during a previous pregnancy.  You are pregnant with more than one baby, such as twins. What are the signs or symptoms? Symptoms of this condition include:  Nausea.  Vomiting. How is this diagnosed? This condition is usually diagnosed based on your signs and symptoms. How is this treated? In many cases, treatment is not needed for this condition. Making some changes to what you eat may help to control symptoms. Your health care provider may also prescribe or recommend:  Vitamin B6 supplements.  Anti-nausea medicines.  Ginger. Follow these instructions at home: Medicines  Take over-the-counter and prescription medicines only as told by your health care provider. Do not use any prescription, over-the-counter, or herbal medicines for morning sickness without first talking with your health care provider.  Taking multivitamins before getting pregnant can prevent or decrease the severity of morning sickness in most women. Eating and drinking  Eat a piece of dry toast or crackers before getting out of bed in the morning.  Eat 5 or 6 small meals a day.  Eat dry and  bland foods, such as rice or a baked potato. Foods that are high in carbohydrates are often helpful.  Avoid greasy, fatty, and spicy foods.  Have someone cook for you if the smell of any food causes nausea and vomiting.  If you feel nauseous after taking prenatal vitamins, take the vitamins at night or with a snack.  Snack on protein foods between meals if you are hungry. Nuts, yogurt, and cheese are good options.  Drink fluids throughout the day.  Try ginger ale made with real ginger, ginger tea made from fresh grated ginger, or ginger candies. General instructions  Do not use any products that contain nicotine or tobacco, such as cigarettes and e-cigarettes. If you need help quitting, ask your health care provider.  Get an air purifier to keep the air in your house free of odors.  Get plenty of fresh air.  Try to avoid odors that trigger your nausea.  Consider trying these methods to help relieve symptoms: ? Wearing an acupressure wristband. These wristbands are often worn for seasickness. ? Acupuncture. Contact a health care provider if:  Your home remedies are not working and you need medicine.  You feel dizzy or light-headed.  You are losing weight. Get help right away if:  You have persistent and uncontrolled nausea and vomiting.  You faint.  You have severe pain in your abdomen. Summary  Morning sickness is when a woman feels nauseous during pregnancy. This nauseous feeling may or may not come with vomiting.  Morning sickness is most common during the first trimester.  It often occurs in the morning, but it can be a problem at  any time of day. °· In many cases, treatment is not needed for this condition. Making some changes to what you eat may help to control symptoms. °This information is not intended to replace advice given to you by your health care provider. Make sure you discuss any questions you have with your health care provider. °Document Released:  12/31/2006 Document Revised: 10/22/2017 Document Reviewed: 12/12/2016 °Elsevier Patient Education © 2020 [employer]vier Inc. ° ° ° °Safe Medications in Pregnancy  ° °Acne: °Benzoyl Peroxide °Salicylic Acid ° °Backache/Headache: °Tylenol: 2 regular strength every 4 hours OR °             2 Extra strength every 6 hours ° °Colds/Coughs/Allergies: °Benadryl (alcohol free) 25 mg every 6 hours as needed °Breath right strips °Claritin °Cepacol throat lozenges °Chloraseptic throat spray °Cold-Eeze- up to three times per day °Cough drops, alcohol free °Flonase (by prescription only) °Guaifenesin °Mucinex °Robitussin DM (plain only, alcohol free) °Saline nasal spray/drops °Sudafed (pseudoephedrine) & Actifed ** use only after [redacted] weeks gestation and if you do not have high blood pressure °Tylenol °Vicks Vaporub °Zinc lozenges °Zyrtec  ° °Constipation: °Colace °Ducolax suppositories °Fleet enema °Glycerin suppositories °Metamucil °Milk of magnesia °Miralax °Senokot °Smooth move tea ° °Diarrhea: °Kaopectate °Imodium A-D ° °*NO pepto Bismol ° °Hemorrhoids: °Anusol °Anusol HC °Preparation H °Tucks ° °Indigestion: °Tums °Maalox °Mylanta °Zantac  °Pepcid ° °Insomnia: °Benadryl (alcohol free) 25mg every 6 hours as needed °Tylenol PM °Unisom, no Gelcaps ° °Leg Cramps: °Tums °MagGel ° °Nausea/Vomiting:  °Bonine °Dramamine °Emetrol °Ginger extract °Sea bands °Meclizine  °Nausea medication to take during pregnancy:  °Unisom (doxylamine succinate 25 mg tablets) Take one tablet daily at bedtime. If symptoms are not adequately controlled, the dose can be increased to a maximum recommended dose of two tablets daily (1/2 tablet in the morning, 1/2 tablet mid-afternoon and one at bedtime). °Vitamin B6 100mg tablets. Take one tablet twice a day (up to 200 mg per day). ° °Skin Rashes: °Aveeno products °Benadryl cream or 25mg every 6 hours as needed °Calamine Lotion °1% cortisone cream ° °Yeast infection: °Gyne-lotrimin 7 °Monistat 7 ° °Gum/tooth  pain: °Anbesol ° °**If taking multiple medications, please check labels to avoid duplicating the same active ingredients °**take medication as directed on the label °** Do not exceed 4000 mg of tylenol in 24 hours °**Do not take medications that contain aspirin or ibuprofen ° ° ° ° °

## 2019-10-18 NOTE — ED Triage Notes (Addendum)
Pt reports continued emesis, pt seen here 2 days ago for the same but states the medication she was given is not working (Reglan). Pt also reports some lower back pain and abd pain. Pt [redacted] weeks pregnant. Denies vaginal bleeding

## 2019-10-18 NOTE — MAU Note (Signed)
Ongoing vomiting.  Can't keep anything down.  Reglan not working.  Feeling light headed.

## 2019-10-18 NOTE — MAU Provider Note (Signed)
Chief Complaint: Emesis and Dizziness   First Provider Initiated Contact with Patient 10/18/19 1616     SUBJECTIVE HPI: Makayla White is a 23 y.o. [id number] at [redacted]w[redacted]d who presents to [facility] reporting nausea & vomiting. Was recently prescribed reglan but states it doesn't work. Last took reglan yesterday morning. Has vomited 4 times today. States she was able to keep down oatmeal this morning.  Plans on going to [facility] for prenatal care but hasn't made an appointment yet. Denies abdominal pain or vaginal bleeding. IUP confirmed during ED visit on 11/23.    Past Medical History:  Diagnosis Date  . Asthma   . Chlamydia   . Gonorrhea   . Scoliosis    OB History  Gravida Para Term Preterm AB Living  3 2 2     2   SAB TAB Ectopic Multiple Live Births        0 2    # Outcome Date GA Lbr Len/2nd Weight Sex Delivery Anes PTL Lv  3 Current           2 Term 08/04/18 [redacted]w[redacted]d  1956 g F Vag-Spont None  LIV  1 Term 02/19/17 [redacted]w[redacted]d 02:01 / 00:17 2900 g M Vag-Spont None  LIV   Past Surgical History:  Procedure Laterality Date  . NOSE SURGERY    . TONSILLECTOMY     Social History   Socioeconomic History  . Marital status: Single    Spouse name: Not on file  . Number of children: Not on file  . Years of education: Not on file  . Highest education level: Not on file  Occupational History  . Not on file  Social Needs  . Financial resource strain: Not on file  . Food insecurity    Worry: Not on file    Inability: Not on file  . Transportation needs    Medical: Not on file    Non-medical: Not on file  Tobacco Use  . Smoking status: Never Smoker  . Smokeless tobacco: Never Used  Substance and Sexual Activity  . Alcohol use: No  . Drug use: No  . Sexual activity: Yes  Lifestyle  . Physical activity    Days per week: Not on file    Minutes per session: Not on file  . Stress: Not on file  Relationships  . Social Herbalist on phone: Not on file     Gets together: Not on file    Attends religious service: Not on file    Active member of club or organization: Not on file    Attends meetings of clubs or organizations: Not on file    Relationship status: Not on file  . Intimate partner violence    Fear of current or ex partner: No    Emotionally abused: No    Physically abused: No    Forced sexual activity: No  Other Topics Concern  . Not on file  Social History Narrative  . Not on file   Family History  Problem Relation Age of Onset  . Lupus Mother   . Healthy Father    No current facility-administered medications on file prior to encounter.    Current Outpatient Medications on File Prior to Encounter  Medication Sig Dispense Refill  . Prenatal MV-Min-Fe Fum-FA-DHA (PRENATAL 1 PO) Take by mouth.     No Known Allergies  I have reviewed patient's Past Medical Hx, Surgical Hx, Family Hx, Social Hx, medications and allergies.   Review of  Systems  Constitutional: Negative.   Gastrointestinal: Positive for nausea and vomiting. Negative for abdominal pain, constipation and diarrhea.  Genitourinary: Negative.     OBJECTIVE Patient Vitals for the past 24 hrs:  BP Temp Temp src Pulse Resp SpO2 Height Weight  10/18/19 1637 (!) 110/54 - - 74 - - - -  10/18/19 1453 109/60 98.4 F (36.9 C) Oral 69 16 98 % 5\' 5"  (1.651 m) 63 kg  10/18/19 1409 110/66 99.2 F (37.3 C) Oral 72 14 100 % - -   Constitutional: Well-developed, well-nourished female in no acute distress.  Cardiovascular: normal rate & rhythm, no murmur Respiratory: normal rate and effort. Lung sounds clear throughout GI: Abd soft, non-tender, Pos BS x 4. No guarding or rebound tenderness MS: Extremities nontender, no edema, normal ROM Neurologic: Alert and oriented x 4.      LAB RESULTS Results for orders placed or performed during the hospital encounter of 10/18/19 (from the past 24 hour(s))  Urinalysis, Routine w reflex microscopic     Status: Abnormal    Collection Time: 10/18/19  3:46 PM  Result Value Ref Range   Color, Urine YELLOW YELLOW   APPearance CLEAR CLEAR   Specific Gravity, Urine 1.026 1.005 - 1.030   pH 6.0 5.0 - 8.0   Glucose, UA NEGATIVE NEGATIVE mg/dL   Hgb urine dipstick NEGATIVE NEGATIVE   Bilirubin Urine NEGATIVE NEGATIVE   Ketones, ur 5 (A) NEGATIVE mg/dL   Protein, ur 30 (A) NEGATIVE mg/dL   Nitrite NEGATIVE NEGATIVE   Leukocytes,Ua NEGATIVE NEGATIVE   RBC / HPF 0-5 0 - 5 RBC/hpf   WBC, UA 0-5 0 - 5 WBC/hpf   Bacteria, UA NONE SEEN NONE SEEN   Squamous Epithelial / LPF 0-5 0 - 5   Mucus PRESENT     IMAGING No results found.  MAU COURSE Orders Placed This Encounter  Procedures  . Urinalysis, Routine w reflex microscopic  . Discharge patient   Meds ordered this encounter  Medications  . promethazine (PHENERGAN) tablet 25 mg  . Doxylamine-Pyridoxine 10-10 MG TBEC    Sig: Start with 2 tablets every evening. If symptoms persist, add 1 tablet every morning. If symptoms persist, add 1 tablet at mid-day.    Dispense:  120 tablet    Refill:  0    Order Specific Question:   Supervising Provider    Answer:   Conan BowensAVIS, KELLY M [[id number]]  . promethazine (PHENERGAN) 25 MG tablet    Sig: Take 1 tablet (25 mg total) by mouth every 6 (six) hours as needed for nausea or vomiting.    Dispense:  30 tablet    Refill:  0    Order Specific Question:   Supervising Provider    Answer:   Conan BowensDAVIS, KELLY M [[id number]]    MDM Oral phenergan given in MAU Pt appears stable & is currently not vomiting.  Discussed medication adjustments & expectations Phenergan 25 mg PO given in MAU & tolerated well  ASSESSMENT 1. Nausea and vomiting during pregnancy prior to [redacted] weeks gestation   2. [redacted] weeks gestation of pregnancy     PLAN Discharge home in stable condition. Discussed reasons to return to MAU D/c reglan Rx phenergan & diclegis Start prenatal care  Follow-up Information    [facility].    Specialty: Obstetrics and Gynecology Contact information: [address], [address] [place] [id number] [phone]         Allergies as  of 10/18/2019   No Known Allergies     Medication List    STOP taking these medications   ibuprofen 600 MG tablet Commonly known as: ADVIL   metoCLOPramide 10 MG tablet Commonly known as: REGLAN   ondansetron 4 MG tablet Commonly known as: ZOFRAN     TAKE these medications   Doxylamine-Pyridoxine 10-10 MG Tbec Start with 2 tablets every evening. If symptoms persist, add 1 tablet every morning. If symptoms persist, add 1 tablet at mid-day.   PRENATAL 1 PO Take by mouth.   promethazine 25 MG tablet Commonly known as: PHENERGAN Take 1 tablet (25 mg total) by mouth every 6 (six) hours as needed for nausea or vomiting.        Judeth Horn, NP 10/18/2019  5:30 PM

## 2019-11-21 DIAGNOSIS — Z3480 Encounter for supervision of other normal pregnancy, unspecified trimester: Secondary | ICD-10-CM | POA: Insufficient documentation

## 2019-11-22 ENCOUNTER — Other Ambulatory Visit (HOSPITAL_COMMUNITY)
Admission: RE | Admit: 2019-11-22 | Discharge: 2019-11-22 | Disposition: A | Discharge status: Home / Self Care | Payer: Medicaid Other | Source: Ambulatory Visit | Attending: Medical | Admitting: Medical

## 2019-11-22 ENCOUNTER — Encounter: Payer: Self-pay | Admitting: Medical

## 2019-11-22 ENCOUNTER — Ambulatory Visit (INDEPENDENT_AMBULATORY_CARE_PROVIDER_SITE_OTHER): Payer: Medicaid Other | Admitting: Medical

## 2019-11-22 ENCOUNTER — Other Ambulatory Visit: Payer: Self-pay

## 2019-11-22 VITALS — BP 112/67 | HR 84 | Temp 98.5°F | Wt 147.0 lb

## 2019-11-22 DIAGNOSIS — Z3481 Encounter for supervision of other normal pregnancy, first trimester: Secondary | ICD-10-CM | POA: Diagnosis not present

## 2019-11-22 DIAGNOSIS — O09891 Supervision of other high risk pregnancies, first trimester: Secondary | ICD-10-CM

## 2019-11-22 DIAGNOSIS — O98811 Other maternal infectious and parasitic diseases complicating pregnancy, first trimester: Secondary | ICD-10-CM

## 2019-11-22 DIAGNOSIS — Z23 Encounter for immunization: Secondary | ICD-10-CM

## 2019-11-22 DIAGNOSIS — Z659 Problem related to unspecified psychosocial circumstances: Secondary | ICD-10-CM

## 2019-11-22 DIAGNOSIS — O09899 Supervision of other high risk pregnancies, unspecified trimester: Secondary | ICD-10-CM | POA: Insufficient documentation

## 2019-11-22 DIAGNOSIS — Z3480 Encounter for supervision of other normal pregnancy, unspecified trimester: Secondary | ICD-10-CM | POA: Insufficient documentation

## 2019-11-22 DIAGNOSIS — A749 Chlamydial infection, unspecified: Secondary | ICD-10-CM

## 2019-11-22 DIAGNOSIS — Z3A11 11 weeks gestation of pregnancy: Secondary | ICD-10-CM

## 2019-11-22 DIAGNOSIS — Z8759 Personal history of other complications of pregnancy, childbirth and the puerperium: Secondary | ICD-10-CM | POA: Insufficient documentation

## 2019-11-22 MED ORDER — BLOOD PRESSURE KIT DEVI: 1.0000 | 0 refills | Status: DC

## 2019-11-22 NOTE — Progress Notes (Signed)
New OB. C/o NV.  FLU vaccine given in RD, tolerated well.

## 2019-11-22 NOTE — Patient Instructions (Addendum)
Safe Medications in Pregnancy   Acne: Benzoyl Peroxide Salicylic Acid  Backache/Headache: Tylenol: 2 regular strength every 4 hours OR              2 Extra strength every 6 hours  Colds/Coughs/Allergies: Benadryl (alcohol free) 25 mg every 6 hours as needed Breath right strips Claritin Cepacol throat lozenges Chloraseptic throat spray Cold-Eeze- up to three times per day Cough drops, alcohol free Flonase (by prescription only) Guaifenesin Mucinex Robitussin DM (plain only, alcohol free) Saline nasal spray/drops Sudafed (pseudoephedrine) & Actifed ** use only after [redacted] weeks gestation and if you do not have high blood pressure Tylenol Vicks Vaporub Zinc lozenges Zyrtec   Constipation: Colace Ducolax suppositories Fleet enema Glycerin suppositories Metamucil Milk of magnesia Miralax Senokot Smooth move tea  Diarrhea: Kaopectate Imodium A-D  *NO pepto Bismol  Hemorrhoids: Anusol Anusol HC Preparation H Tucks  Indigestion: Tums Maalox Mylanta Zantac  Pepcid  Insomnia: Benadryl (alcohol free) 25mg every 6 hours as needed Tylenol PM Unisom, no Gelcaps  Leg Cramps: Tums MagGel  Nausea/Vomiting:  Bonine Dramamine Emetrol Ginger extract Sea bands Meclizine  Nausea medication to take during pregnancy:  Unisom (doxylamine succinate 25 mg tablets) Take one tablet daily at bedtime. If symptoms are not adequately controlled, the dose can be increased to a maximum recommended dose of two tablets daily (1/2 tablet in the morning, 1/2 tablet mid-afternoon and one at bedtime). Vitamin B6 100mg tablets. Take one tablet twice a day (up to 200 mg per day).  Skin Rashes: Aveeno products Benadryl cream or 25mg every 6 hours as needed Calamine Lotion 1% cortisone cream  Yeast infection: Gyne-lotrimin 7 Monistat 7   **If taking multiple medications, please check labels to avoid duplicating the same active ingredients **take medication as directed on  the label ** Do not exceed 4000 mg of tylenol in 24 hours **Do not take medications that contain aspirin or ibuprofen   Childbirth Education Options: [place] [facility] Classes:  Childbirth education classes can help you get ready for a positive parenting experience. You can also meet other expectant parents and get free stuff for your baby. Each class runs for five weeks on the same night and costs $45 for the mother-to-be and her support person. Medicaid covers the cost if you are eligible. Call [phone] to register. [facility] Childbirth Education:  [phone] or [phone] or [email or web address]  Baby & Me Class: Discuss newborn & infant parenting and family adjustment issues with other new mothers in a relaxed environment. Each week brings a new speaker or baby-centered activity. We encourage new mothers to join us every Thursday at 11:00am. Babies birth until crawling. No registration or fee. Daddy Boot Camp: This course offers Dads-to-be the tools and knowledge needed to feel confident on their journey to becoming new fathers. Experienced dads, who have been trained as coaches, teach dads-to-be how to hold, comfort, diaper, swaddle and play with their infant while being able to support the new mom as well. A class for men taught by men. $25/dad Big Brother/Big Sister: Let your children share in the joy of a new brother or sister in this special class designed just for them. Class includes discussion about how families care for babies: swaddling, holding, diapering, safety as well as how they can be helpful in their new role. This class is designed for children ages 2 to 6, but any age is welcome. Please register each child individually. $5/child  Mom Talk: This mom-led group offers support and connection to   mothers as they journey through the adjustments and struggles of that sometimes overwhelming first year after the birth of a child. Tuesdays at 10:00am and  Thursdays at 6:00pm. Babies welcome. No registration or fee. Breastfeeding Support Group: This group is a mother-to-mother support circle where moms have the opportunity to share their breastfeeding experiences. A Lactation Consultant is present for questions and concerns. Meets each Tuesday at 11:00am. No fee or registration. Breastfeeding Your Baby: Learn what to expect in the first days of breastfeeding your newborn.  This class will help you feel more confident with the skills needed to begin your breastfeeding experience. Many new mothers are concerned about breastfeeding after leaving the hospital. This class will also address the most common fears and challenges about breastfeeding during the first few weeks, months and beyond. (call for fee) Comfort Techniques and Tour: This 2 hour interactive class will provide you the opportunity to learn & practice hands-on techniques that can help relieve some of the discomfort of labor and encourage your baby to rotate toward the best position for birth. You and your partner will be able to try a variety of labor positions with birth balls and rebozos as well as practice breathing, relaxation, and visualization techniques. A tour of the [facility] is included with this class. $20 per registrant and support person Childbirth Class- Weekend Option: This class is a Weekend version of our Birth & Baby series. It is designed for parents who have a difficult time fitting several weeks of classes into their schedule. It covers the care of your newborn and the basics of labor and childbirth. It also includes a Maternity Care Center Tour of [facility] and lunch. The class is held two consecutive days: beginning on Friday evening from 6:30 - 8:30 p.m. and the next day, Saturday from 9 a.m. - 4 p.m. (call for fee) Waterbirth Class: Interested in a waterbirth?  This informational class will help you discover whether waterbirth is the right fit  for you. Education about waterbirth itself, supplies you would need and how to assemble your support team is what you can expect from this class. Some obstetrical practices require this class in order to pursue a waterbirth. (Not all obstetrical practices offer waterbirth-check with your healthcare provider.) Register only the expectant mom, but you are encouraged to bring your partner to class! Required if planning waterbirth, no fee. Infant/Child CPR: Parents, grandparents, babysitters, and friends learn Cardio-Pulmonary Resuscitation skills for infants and children. You will also learn how to treat both conscious and unconscious choking in infants and children. This Family & Friends program does not offer certification. Register each participant individually to ensure that enough mannequins are available. (Call for fee) Grandparent Love: Expecting a grandbaby? This class is for you! Learn about the latest infant care and safety recommendations and ways to support your own child as he or she transitions into the parenting role. Taught by Registered Nurses who are childbirth instructors, but most importantly...they are grandmothers too! $10/person. Childbirth Class- Natural Childbirth: This series of 5 weekly classes is for expectant parents who want to learn and practice natural methods of coping with the process of labor and childbirth. Relaxation, breathing, massage, visualization, role of the partner, and helpful positioning are highlighted. Participants learn how to be confident in their body's ability to give birth. This class will empower and help parents make informed decisions about their own care. Includes discussion that will help new parents transition into the immediate postpartum period. Maternity   Care Center Tour of [facility] is included. We suggest taking this class between 25-32 weeks, but it's only a recommendation. $75 per registrant and one support person or $30 Medicaid. Childbirth  Class- 3 week Series: This option of 3 weekly classes helps you and your labor partner prepare for childbirth. Newborn care, labor & birth, cesarean birth, pain management, and comfort techniques are discussed and a Maternity Care Center Tour of [facility] is included. The class meets at the same time, on the same day of the week for 3 consecutive weeks beginning with the starting date you choose. $60 for registrant and one support person.  Marvelous Multiples: Expecting twins, triplets, or more? This class covers the differences in labor, birth, parenting, and breastfeeding issues that face multiples' parents. NICU tour is included. Led by a Certified Childbirth Educator who is the mother of twins. No fee. Caring for Baby: This class is for expectant and adoptive parents who want to learn and practice the most up-to-date newborn care for their babies. Focus is on birth through the first six weeks of life. Topics include feeding, bathing, diapering, crying, umbilical cord care, circumcision care and safe sleep. Parents learn to recognize symptoms of illness and when to call the pediatrician. Register only the mom-to-be and your partner or support person can plan to come with you! $10 per registrant and support person Childbirth Class- online option: This online class offers you the freedom to complete a Birth and Baby series in the comfort of your own home. The flexibility of this option allows you to review sections at your own pace, at times convenient to you and your support people. It includes additional video information, animations, quizzes, and extended activities. Get organized with helpful eClass tools, checklists, and trackers. Once you register online for the class, you will receive an email within a few days to accept the invitation and begin the class when the time is right for you. The content will be available to you for 60 days. $60 for 60 days of online access for you and your support  people.  Local Doulas: Natural Baby Doulas [email or web address] Tel: [phone] [email or web address] [facility] [phone] [email or web address] The Labor Ladies  (also do waterbirth tub rental) [phone] [email or web address] [email or web address] [phone] [email or web address] Sacred [employer]hyth[employer]  [phone] [email or web address] [place] [employer] (PADA) [email or web address] [email or web address].org/index.htm [place] [facility]  [email or web address] Considering Waterbirth? Guide for patients at [facility]  Why consider waterbirth?  . Gentle birth for babies . Less pain medicine used in labor . May allow for passive descent/less pushing . May reduce perineal tears  . More mobility and instinctive maternal position changes . Increased maternal relaxation . Reduced blood pressure in labor  Is waterbirth safe? What are the risks of infection, drowning or other complications?  . Infection: o Very low risk (3.7 % for tub vs 4.8% for bed) o 7 in 8000 waterbirths with documented infection o Poorly cleaned equipment most common cause o Slightly lower group B strep transmission rate  . Drowning o Maternal:  - Very low risk   - Related to seizures or fainting o Newborn:  - Very low risk. No evidence of increased risk of respiratory problems in multiple large studies - Physiological protection from breathing under water - Avoid underwater birth if there are any fetal complications - Once baby's head is out of the water, keep it out.  . Birth complication o Some reports of cord trauma, but risk decreased by   bringing baby to surface gradually o No evidence of increased risk of shoulder dystocia. Mothers can usually change positions faster in water than in a bed, possibly aiding the  maneuvers to free the shoulder.   You must attend a Waterbirth class at [facility]  3rd Wednesday of every month from 7-9pm  Free  Register by calling [phone] or online at [email or web address]  Bring us the certificate from the class to your prenatal appointment  Meet with a midwife at 36 weeks to see if you can still plan a waterbirth and to sign the consent.   Purchase or rent the following supplies:   Water Birth Pool (Birth Pool in a Box or [place] for instance)  (Tubs start ~$125)  Single-use disposable tub liner designed for your brand of tub  New garden hose labeled "lead-free", "suitable for drinking water",  Electric drain pump to remove water (We recommend 792 gallon per hour or greater pump.)   Separate garden hose to remove the dirty water  Fish net  Bathing suit top (optional)  Long-handled mirror (optional)  Places to purchase or rent supplies  Yourwaterbirth.com for tub purchases and supplies  Waterbirthsolutions.com for tub purchases and supplies  The Labor Ladies ([email or web address]) $275 for tub rental/set-up & take down/kit   [place] [employer] (http://www.padanc.org/MeetUs.htm) Information regarding doulas (labor support) who provide pool rentals  Our practice has a Birth Pool in a Box tub at the hospital that you may borrow on a first-come-first-served basis. It is your responsibility to to set up, clean and break down the tub. We cannot guarantee the availability of this tub in advance. You are responsible for bringing all accessories listed above. If you do not have all necessary supplies you cannot have a waterbirth.    Things that would prevent you from having a waterbirth:  Premature, <37wks  Previous cesarean birth  Presence of thick meconium-stained fluid  Multiple gestation (Twins, triplets, etc.)  Uncontrolled diabetes or gestational diabetes requiring medication  Hypertension requiring medication  or diagnosis of pre-eclampsia  Heavy vaginal bleeding  Non-reassuring fetal heart rate  Active infection (MRSA, etc.). Group B Strep is NOT a contraindication for  waterbirth.  If your labor has to be induced and induction method requires continuous  monitoring of the baby's heart rate  Other risks/issues identified by your obstetrical provider  Please remember that birth is unpredictable. Under certain unforeseeable circumstances your provider may advise against giving birth in the tub. These decisions will be made on a case-by-case basis and with the safety of you and your baby as our highest priority.      

## 2019-11-22 NOTE — Progress Notes (Signed)
   PRENATAL VISIT NOTE  Subjective:  Makayla White is a 23 y.o. G3P2002 at [redacted]w[redacted]d being seen today for her first prenatal visit for this pregnancy.  She is currently monitored for the following issues for this high-risk pregnancy and has Supervision of other normal pregnancy, antepartum; Short interval between pregnancies affecting pregnancy, antepartum; History of prior pregnancy with SGA newborn; and History of precipitous delivery on their problem list.  Patient reports nausea and vomiting.  Contractions: Not present. Vag. Bleeding: None.  Movement: Absent. Denies leaking of fluid.   She is planning to breastfeed. Desires PP IUD for contraception.   The following portions of the patient's history were reviewed and updated as appropriate: allergies, current medications, past family history, past medical history, past social history, past surgical history and problem list.   Objective:   Vitals:   11/22/19 1352  BP: 112/67  Pulse: 84  Temp: 98.5 F (36.9 C)  Weight: 147 lb (66.7 kg)    Fetal Status: Fetal Heart Rate (bpm): 156   Movement: Absent     General:  Alert, oriented and cooperative. Patient is in no acute distress.  Skin: Skin is warm and dry. No rash noted.   Cardiovascular: Normal heart rate and rhythm noted  Respiratory: Normal respiratory effort, no problems with respiration noted. Clear to auscultation.   Abdomen: Soft, gravid, appropriate for gestational age. Normal bowel sounds. Non-tender. Pain/Pressure: Absent     Pelvic: Cervical exam performed Dilation: Closed Effacement (%): Thick   Normal cervical contour, no lesions, no bleeding following pap, normal discharge  Extremities: Normal range of motion.  Edema: None  Mental Status: Normal mood and affect. Normal behavior. Normal judgment and thought content.   Assessment and Plan:  Pregnancy: G3P2002 at [redacted]w[redacted]d 1. Supervision of other normal pregnancy, antepartum - Enroll Patient in Babyscripts -  Cervicovaginal ancillary only( [place]) - Obstetric Panel, Including HIV - Culture, OB Urine - Genetic Screening - Panorama and Horizon  - [place] MFM OB DETAIL +14 WK; scheduled - Pap smear done at [facility] 01/2019 - ROI sent  2. Short interval between pregnancies affecting pregnancy, antepartum - Last delivery 07/2018  3. History of prior pregnancy with SGA newborn - Last delivery 07/2018 - 4lbs 5 oz at term  4. History of precipitous delivery - Delivered en route to hospital with last pregnancy   5. Social problem - Ambulatory referral to [place]  Preterm labor/ first trimester warning symptoms and general obstetric precautions including but not limited to vaginal bleeding, contractions, leaking of fluid and fetal movement were reviewed in detail with the patient. Please refer to After Visit Summary for other counseling recommendations.   Return in about 4 weeks (around 12/20/2019) for LOB, Virtual.  Future Appointments  Date Time Provider [place]  01/17/2020 10:00 AM WH-MFC [place] 3 WH-MFCUS MFC-US  01/17/2020 10:10 AM [place] MFC-US    Kerry Hough, PA-C

## 2019-11-23 DIAGNOSIS — O98811 Other maternal infectious and parasitic diseases complicating pregnancy, first trimester: Secondary | ICD-10-CM | POA: Insufficient documentation

## 2019-11-23 DIAGNOSIS — A749 Chlamydial infection, unspecified: Secondary | ICD-10-CM | POA: Insufficient documentation

## 2019-11-23 LAB — OBSTETRIC PANEL, INCLUDING HIV
Antibody Screen: NEGATIVE
Basophils Absolute: 0.1 10*3/uL (ref 0.0–0.2)
Basos: 0 %
EOS (ABSOLUTE): 0.1 10*3/uL (ref 0.0–0.4)
Eos: 1 %
HIV Screen 4th Generation wRfx: NONREACTIVE
Hematocrit: 31.1 % — ABNORMAL LOW (ref 34.0–46.6)
Hemoglobin: 10.3 g/dL — ABNORMAL LOW (ref 11.1–15.9)
Hepatitis B Surface Ag: NEGATIVE
Immature Grans (Abs): 0 10*3/uL (ref 0.0–0.1)
Immature Granulocytes: 0 %
Lymphocytes Absolute: 3.5 10*3/uL — ABNORMAL HIGH (ref 0.7–3.1)
Lymphs: 30 %
MCH: 27.2 pg (ref 26.6–33.0)
MCHC: 33.1 g/dL (ref 31.5–35.7)
MCV: 82 fL (ref 79–97)
Monocytes Absolute: 0.7 10*3/uL (ref 0.1–0.9)
Monocytes: 6 %
Neutrophils Absolute: 7.6 10*3/uL — ABNORMAL HIGH (ref 1.4–7.0)
Neutrophils: 63 %
Platelets: 445 10*3/uL (ref 150–450)
RBC: 3.79 x10E6/uL (ref 3.77–5.28)
RDW: 15.7 % — ABNORMAL HIGH (ref 11.7–15.4)
RPR Ser Ql: NONREACTIVE
Rh Factor: POSITIVE
Rubella Antibodies, IGG: 2.47 index (ref >0.99)
WBC: 12 10*3/uL — ABNORMAL HIGH (ref 3.4–10.8)

## 2019-11-23 LAB — CERVICOVAGINAL ANCILLARY ONLY
Chlamydia: POSITIVE — AB
Comment: NEGATIVE
Comment: NORMAL
Neisseria Gonorrhea: NEGATIVE

## 2019-11-23 MED ORDER — AZITHROMYCIN 250 MG PO TABS: 1000.0000 mg | ORAL_TABLET | Freq: Once | ORAL | 0 refills | Status: AC

## 2019-11-23 NOTE — Addendum Note (Signed)
Addended by: Luvenia Redden on: 11/23/2019 02:46 PM   Modules accepted: Orders

## 2019-11-24 NOTE — L&D Delivery Note (Signed)
Delivery Note At 10:54 AM a viable female was delivered via SVD (Presentation:    cephalic  ).  APGAR: , ; weight  .  2 lb 4 oz Placenta status: Spontaneous;Pathology, Intact.  Cord:   with the following complications:  .  Cord pH: no result Small clot delivered with baby, possible abruptio Anesthesia:  none Episiotomy:  none Lacerations:  none Suture Repair: n/a Est. Blood Loss (mL):  400 ml  Mom to postpartum.  Baby to NICU.  Scheryl Darter 03/09/2020, 11:07 AM

## 2019-11-26 LAB — CULTURE, OB URINE

## 2019-11-26 LAB — URINE CULTURE, OB REFLEX

## 2019-11-27 ENCOUNTER — Institutional Professional Consult (permissible substitution): Payer: Medicaid Other | Admitting: Licensed Clinical Social Worker

## 2019-11-28 ENCOUNTER — Telehealth: Payer: Self-pay

## 2019-11-28 NOTE — Telephone Encounter (Signed)
TC to pt to make aware of results pt not ava left vm.

## 2019-11-29 ENCOUNTER — Encounter: Payer: Self-pay | Admitting: Medical

## 2019-11-30 ENCOUNTER — Encounter: Payer: Self-pay | Admitting: Medical

## 2019-12-04 ENCOUNTER — Telehealth (INDEPENDENT_AMBULATORY_CARE_PROVIDER_SITE_OTHER): Payer: Medicaid Other | Admitting: Licensed Clinical Social Worker

## 2019-12-04 DIAGNOSIS — Z659 Problem related to unspecified psychosocial circumstances: Secondary | ICD-10-CM

## 2019-12-05 NOTE — Progress Notes (Signed)
Patient reports she is concerning terminating this current pregnancy because she is not sure who is the father. Patient reports moving into a new apartment and has strong family support. Patient reports she is no longer in a relationship with either men and feels safe in her new apartment. Patient reports she does not have any emergent concerns or issues

## 2019-12-20 ENCOUNTER — Telehealth: Payer: Medicaid Other | Admitting: Obstetrics

## 2019-12-20 NOTE — Progress Notes (Signed)
Patient did not have a phone.  Will schedule visit at a later date.

## 2019-12-22 ENCOUNTER — Encounter: Payer: Self-pay | Admitting: Obstetrics

## 2019-12-22 ENCOUNTER — Telehealth (INDEPENDENT_AMBULATORY_CARE_PROVIDER_SITE_OTHER): Payer: Medicaid Other | Admitting: Obstetrics

## 2019-12-22 DIAGNOSIS — O0992 Supervision of high risk pregnancy, unspecified, second trimester: Secondary | ICD-10-CM

## 2019-12-22 DIAGNOSIS — O0990 Supervision of high risk pregnancy, unspecified, unspecified trimester: Secondary | ICD-10-CM

## 2019-12-22 DIAGNOSIS — O09899 Supervision of other high risk pregnancies, unspecified trimester: Secondary | ICD-10-CM

## 2019-12-22 DIAGNOSIS — Z3A15 15 weeks gestation of pregnancy: Secondary | ICD-10-CM

## 2019-12-22 DIAGNOSIS — Z8759 Personal history of other complications of pregnancy, childbirth and the puerperium: Secondary | ICD-10-CM

## 2019-12-22 NOTE — Progress Notes (Signed)
   TELEHEALTH OBSTETRICS PRENATAL VIRTUAL VIDEO VISIT ENCOUNTER NOTE  Provider location: [facility] [place]   I connected with Makayla White on 12/22/19 at 10:00 AM EST by OB MyChart Video Encounter at home and verified that I am speaking with the correct person using two identifiers.   I discussed the limitations, risks, security and privacy concerns of performing an evaluation and management service virtually and the availability of in person appointments. I also discussed with the patient that there may be a patient responsible charge related to this service. The patient expressed understanding and agreed to proceed. Subjective:  Makayla White is a 24 y.o. G3P2002 at [redacted]w[redacted]d being seen today for ongoing prenatal care.  She is currently monitored for the following issues for this high-risk pregnancy and has Supervision of other normal pregnancy, antepartum; Short interval between pregnancies affecting pregnancy, antepartum; History of prior pregnancy with SGA newborn; History of precipitous delivery; and Chlamydia infection affecting pregnancy in first trimester, antepartum on their problem list.  Patient reports no complaints.   .  .   . Denies any leaking of fluid.   The following portions of the patient's history were reviewed and updated as appropriate: allergies, current medications, past family history, past medical history, past social history, past surgical history and problem list.   Objective:  There were no vitals filed for this visit.  Fetal Status:           General:  Alert, oriented and cooperative. Patient is in no acute distress.  Respiratory: Normal respiratory effort, no problems with respiration noted  Mental Status: Normal mood and affect. Normal behavior. Normal judgment and thought content.  Rest of physical exam deferred due to type of encounter  Imaging: No results found.  Assessment and Plan:  Pregnancy: G3P2002 at [redacted]w[redacted]d 1.  Supervision of high risk pregnancy, antepartum  2. Short interval between pregnancies affecting pregnancy, antepartum  3. History of prior pregnancy with SGA newborn   Preterm labor symptoms and general obstetric precautions including but not limited to vaginal bleeding, contractions, leaking of fluid and fetal movement were reviewed in detail with the patient. I discussed the assessment and treatment plan with the patient. The patient was provided an opportunity to ask questions and all were answered. The patient agreed with the plan and demonstrated an understanding of the instructions. The patient was advised to call back or seek an in-person office evaluation/go to [facility] at [facility] for any urgent or concerning symptoms. Please refer to After Visit Summary for other counseling recommendations.   I provided 10 minutes of face-to-face time during this encounter.  Return in about 4 weeks (around 01/19/2020) for MyChart.  Future Appointments  Date Time Provider Department Center  01/17/2020 10:00 AM WH-MFC [place] 3 WH-MFCUS MFC-US  01/17/2020 10:10 AM WH-MFC NURSE WH-MFC MFC-US    Coral Ceo, MD [facility], [facility] 12/22/2019

## 2020-01-17 ENCOUNTER — Encounter (HOSPITAL_COMMUNITY): Payer: Self-pay

## 2020-01-17 ENCOUNTER — Ambulatory Visit (HOSPITAL_COMMUNITY): Payer: Medicaid Other | Admitting: *Deleted

## 2020-01-17 ENCOUNTER — Ambulatory Visit (HOSPITAL_COMMUNITY)
Admission: RE | Admit: 2020-01-17 | Discharge: 2020-01-17 | Disposition: A | Discharge status: Home / Self Care | Payer: Medicaid Other | Source: Ambulatory Visit | Attending: Medical | Admitting: Medical

## 2020-01-17 ENCOUNTER — Other Ambulatory Visit (HOSPITAL_COMMUNITY): Payer: Self-pay | Admitting: *Deleted

## 2020-01-17 ENCOUNTER — Other Ambulatory Visit: Payer: Self-pay

## 2020-01-17 VITALS — BP 106/63 | HR 72 | Temp 97.6°F

## 2020-01-17 DIAGNOSIS — O09892 Supervision of other high risk pregnancies, second trimester: Secondary | ICD-10-CM

## 2020-01-17 DIAGNOSIS — Z3480 Encounter for supervision of other normal pregnancy, unspecified trimester: Secondary | ICD-10-CM | POA: Diagnosis present

## 2020-01-17 DIAGNOSIS — Z8759 Personal history of other complications of pregnancy, childbirth and the puerperium: Secondary | ICD-10-CM | POA: Insufficient documentation

## 2020-01-17 DIAGNOSIS — O09899 Supervision of other high risk pregnancies, unspecified trimester: Secondary | ICD-10-CM | POA: Insufficient documentation

## 2020-01-17 DIAGNOSIS — Z3A19 19 weeks gestation of pregnancy: Secondary | ICD-10-CM | POA: Diagnosis not present

## 2020-01-19 ENCOUNTER — Telehealth (INDEPENDENT_AMBULATORY_CARE_PROVIDER_SITE_OTHER): Payer: Medicaid Other | Admitting: Obstetrics

## 2020-01-19 DIAGNOSIS — Z8759 Personal history of other complications of pregnancy, childbirth and the puerperium: Secondary | ICD-10-CM

## 2020-01-19 DIAGNOSIS — O09899 Supervision of other high risk pregnancies, unspecified trimester: Secondary | ICD-10-CM

## 2020-01-19 DIAGNOSIS — O0990 Supervision of high risk pregnancy, unspecified, unspecified trimester: Secondary | ICD-10-CM

## 2020-01-19 NOTE — Progress Notes (Signed)
Patient is not available for telephone call.

## 2020-01-23 ENCOUNTER — Encounter: Payer: Self-pay | Admitting: Obstetrics

## 2020-01-23 ENCOUNTER — Telehealth (INDEPENDENT_AMBULATORY_CARE_PROVIDER_SITE_OTHER): Payer: Medicaid Other | Admitting: Obstetrics

## 2020-01-23 DIAGNOSIS — Z8759 Personal history of other complications of pregnancy, childbirth and the puerperium: Secondary | ICD-10-CM

## 2020-01-23 DIAGNOSIS — O0990 Supervision of high risk pregnancy, unspecified, unspecified trimester: Secondary | ICD-10-CM

## 2020-01-23 DIAGNOSIS — M549 Dorsalgia, unspecified: Secondary | ICD-10-CM

## 2020-01-23 DIAGNOSIS — F419 Anxiety disorder, unspecified: Secondary | ICD-10-CM

## 2020-01-23 DIAGNOSIS — O98811 Other maternal infectious and parasitic diseases complicating pregnancy, first trimester: Secondary | ICD-10-CM

## 2020-01-23 DIAGNOSIS — O09899 Supervision of other high risk pregnancies, unspecified trimester: Secondary | ICD-10-CM

## 2020-01-23 DIAGNOSIS — A749 Chlamydial infection, unspecified: Secondary | ICD-10-CM

## 2020-01-23 DIAGNOSIS — A549 Gonococcal infection, unspecified: Secondary | ICD-10-CM

## 2020-01-23 MED ORDER — COMFORT FIT MATERNITY SUPP SM MISC: 0 refills | Status: AC

## 2020-01-23 NOTE — Addendum Note (Signed)
Addended by: Coral Ceo A on: 01/23/2020 03:08 PM   Modules accepted: Orders

## 2020-01-23 NOTE — Progress Notes (Signed)
Pt is on the phone preparing for virtual visit with provider. [redacted]w[redacted]d. Pt had anatomy scan [place] on 01/17/20, follow up anatomy scan [place] scheduled for 02/15/20 for further views. Pt reports she has not picked up her BP cuff yet, pt denies HA, blurry vision, swelling, or sharp abdominal pain. Advised pt to call [facility].

## 2020-01-23 NOTE — Progress Notes (Addendum)
TELEHEALTH OBSTETRICS PRENATAL VIRTUAL VIDEO VISIT ENCOUNTER NOTE  Provider location: [facility] for [employer] [facility] at [place]   I connected with Makayla White on 01/23/20 at 11:15 AM EST by OB MyChart Video Encounter at home and verified that I am speaking with the correct person using two identifiers.   I discussed the limitations, risks, security and privacy concerns of performing an evaluation and management service virtually and the availability of in person appointments. I also discussed with the patient that there may be a patient responsible charge related to this service. The patient expressed understanding and agreed to proceed.  Subjective:  Makayla White is a 24 y.o. G3P2002 at [redacted]w[redacted]d being seen today for ongoing prenatal care.  She is currently monitored for the following issues for this high-risk pregnancy and has Supervision of other normal pregnancy, antepartum; Short interval between pregnancies affecting pregnancy, antepartum; History of prior pregnancy with SGA newborn; History of precipitous delivery; and Chlamydia infection affecting pregnancy in first trimester, antepartum on their problem list.  Patient reports backache.  Contractions: Not present. Vag. Bleeding: None.  Movement: Present. Denies any leaking of fluid.   The following portions of the patient's history were reviewed and updated as appropriate: allergies, current medications, past family history, past medical history, past social history, past surgical history and problem list.   Objective:  There were no vitals filed for this visit.  Fetal Status:     Movement: Present     General:  Alert, oriented and cooperative. Patient is in no acute distress.  Respiratory: Normal respiratory effort, no problems with respiration noted  Mental Status: Normal mood and affect. Normal behavior. Normal judgment and thought content.  Rest of physical exam deferred due to type of encounter  Imaging: [place]  MFM OB DETAIL +14 WK  Result Date: 01/17/2020 ----------------------------------------------------------------------  OBSTETRICS REPORT                       (Signed Final 01/17/2020 11:48 am) ---------------------------------------------------------------------- Patient Info  ID #:       [id number]                          D.O.B.:  24-Mar-1996 (23 yrs)  Name:       Fuller Mandril-                Visit Date: 01/17/2020 10:05 am              MUCKLE ---------------------------------------------------------------------- Performed By  Performed By:     Sandi Mealy        Ref. Address:     [address] [place],                                                             [place]  [address]  Attending:        Ma Rings MD         [facility]  Referred By:      Marny Lowenstein                    [place] ---------------------------------------------------------------------- Orders   #  Description                          Code         Ordered By   1  [place] MFM OB DETAIL +14 WK              76811.01     Vonzella Nipple  ----------------------------------------------------------------------   #  Order #                    Accession #                 Episode #   1  [id number]                  [id number]                  [id number]  ---------------------------------------------------------------------- Indications   Short interval between pregancies, 2nd         O09.892   trimester   Encounter for antenatal screening for          Z36.3   malformations (Low risk NIPS)   Poor obstetrical history (SGA)                 O09.299   [redacted] weeks gestation of pregnancy                Z3A.19  ---------------------------------------------------------------------- Fetal Evaluation  Num Of Fetuses:         1  Fetal Heart Rate(bpm):  150  Cardiac Activity:       Observed   Presentation:           Cephalic  Placenta:               Anterior  P. Cord Insertion:      Visualized, central  Amniotic Fluid  AFI FV:      Within normal limits                              Largest Pocket(cm)                              5.81 ---------------------------------------------------------------------- Biometry  BPD:      45.2  mm     G. Age:  19w 5d         73  %    CI:        71.73   %    70 - 86  FL/HC:      19.4   %    16.1 - 18.3  HC:      169.9  mm     G. Age:  19w 4d         65  %    HC/AC:      1.16        1.09 - 1.39  AC:      145.9  mm     G. Age:  19w 6d         71  %    FL/BPD:     72.8   %  FL:       32.9  mm     G. Age:  20w 2d         82  %    FL/AC:      22.5   %    20 - 24  HUM:      29.9  mm     G. Age:  19w 6d         68  %  LV:        2.9  mm  Est. FW:     327  gm    0 lb 12 oz      90  % ---------------------------------------------------------------------- OB History  Gravidity:    3         Term:   2  Living:       2 ---------------------------------------------------------------------- Gestational Age  U/S Today:     19w 6d                                        EDD:   06/06/20  Best:          19w 1d     Det. By:  Previous Ultrasound      EDD:   06/11/20                                      (10/16/19) ---------------------------------------------------------------------- Anatomy  Cranium:               Appears normal         Aortic Arch:            Appears normal  Cavum:                 Appears normal         Ductal Arch:            Not well visualized  Ventricles:            Appears normal         Diaphragm:              Appears normal  Choroid Plexus:        Appears normal         Stomach:                Appears normal, left  sided  Cerebellum:            Appears normal         Abdomen:                Appears normal  Posterior Fossa:       Not well  visualized    Abdominal Wall:         Appears nml (cord                                                                        insert, abd wall)  Nuchal Fold:           Not well visualized    Cord Vessels:           Appears normal (3                                                                        vessel cord)  Face:                  Appears normal         Kidneys:                Appear normal                         (orbits and profile)  Lips:                  Appears normal         Bladder:                Appears normal  Thoracic:              Appears normal         Spine:                  Limited views                                                                        appear normal  Heart:                 Appears normal         Upper Extremities:      Appears normal                         (4CH, axis, and                         situs)  RVOT:                  Not  well visualized    Lower Extremities:      Appears normal  LVOT:                  Appears normal  Other:  Technically difficult due to fetal position. ---------------------------------------------------------------------- Cervix Uterus Adnexa  Cervix  Length:           3.47  cm.  Normal appearance by transabdominal scan. ---------------------------------------------------------------------- Comments  This patient was seen for a detailed fetal anatomy scan due  to a short interval between pregnancies.  The patient reports  that her first pregnancy was a full-term uncomplicated  delivery.  She delivered her second baby at 29 weeks in the  ambulance due to spontaneous preterm labor. She denies  any other significant past medical history and denies any  problems in her current pregnancy.  She had a cell free DNA test earlier in her pregnancy which  indicated a low risk for trisomy 57, 58, and 13. A female fetus  is predicted.  She was informed that the fetal growth and amniotic fluid  level were appropriate for her gestational age.  There were no  obvious fetal anomalies noted on today's  ultrasound exam.  However, today's exam was limited due to  the fetal position.  The patient was informed that anomalies may be missed due  to technical limitations. If the fetus is in a suboptimal position  or maternal habitus is increased, visualization of the fetus in  the maternal uterus may be impaired.  A follow-up exam was scheduled in 4 weeks to complete the  views of the fetal anatomy. ----------------------------------------------------------------------                   Ma Rings, MD Electronically Signed Final Report   01/17/2020 11:48 am ----------------------------------------------------------------------   Assessment and Plan:  Pregnancy: P3A2505 at [redacted]w[redacted]d 1. Supervision of high risk pregnancy, antepartum  2. History of prior pregnancy with SGA newborn  3. Short interval between pregnancies affecting pregnancy, antepartum  4. Chlamydia infection affecting pregnancy in first trimester, antepartum, treated  5. GC (gonococcus infection), treated  6. Anxiety - clinically stable  Preterm labor symptoms and general obstetric precautions including but not limited to vaginal bleeding, contractions, leaking of fluid and fetal movement were reviewed in detail with the patient. I discussed the assessment and treatment plan with the patient. The patient was provided an opportunity to ask questions and all were answered. The patient agreed with the plan and demonstrated an understanding of the instructions. The patient was advised to call back or seek an in-person office evaluation/go to MAU at [facility] for any urgent or concerning symptoms. Please refer to After Visit Summary for other counseling recommendations.   I provided 10 minutes of face-to-face time during this encounter.  Return in about 4 weeks (around 02/20/2020) for MyChart HOB-Faculty Only.  Future Appointments  Date Time Provider Department Center  02/15/2020  10:45 AM WH-MFC NURSE WH-MFC MFC-US  02/15/2020 10:45 AM WH-MFC [place] 5 WH-MFCUS MFC-US    Coral Ceo, MD Center for [facility], [facility] 01/23/2020 11:52 AM

## 2020-02-15 ENCOUNTER — Ambulatory Visit (HOSPITAL_COMMUNITY): Admission: RE | Admit: 2020-02-15 | Payer: Medicaid Other | Source: Ambulatory Visit

## 2020-02-15 ENCOUNTER — Encounter (HOSPITAL_COMMUNITY): Payer: Self-pay

## 2020-02-15 ENCOUNTER — Ambulatory Visit (HOSPITAL_COMMUNITY): Payer: Medicaid Other | Attending: Obstetrics and Gynecology

## 2020-02-20 ENCOUNTER — Encounter: Payer: Medicaid Other | Admitting: Obstetrics & Gynecology

## 2020-02-20 DIAGNOSIS — Z3480 Encounter for supervision of other normal pregnancy, unspecified trimester: Secondary | ICD-10-CM

## 2020-02-20 MED ORDER — BLOOD PRESSURE KIT DEVI: 1.0000 | 0 refills | Status: AC

## 2020-02-20 NOTE — Progress Notes (Signed)
Pt no showed her last u/s appt.   Needs follow up for anatomy.

## 2020-02-26 ENCOUNTER — Telehealth (INDEPENDENT_AMBULATORY_CARE_PROVIDER_SITE_OTHER): Payer: Medicaid Other | Admitting: Obstetrics & Gynecology

## 2020-02-26 DIAGNOSIS — Z3480 Encounter for supervision of other normal pregnancy, unspecified trimester: Secondary | ICD-10-CM

## 2020-02-26 DIAGNOSIS — Z3A24 24 weeks gestation of pregnancy: Secondary | ICD-10-CM

## 2020-02-26 NOTE — Progress Notes (Signed)
   TELEHEALTH VIRTUAL OBSTETRICS VISIT ENCOUNTER NOTE  I connected with Makayla White on 02/26/20 at 10:15 AM EDT by telephone at home and verified that I am speaking with the correct person using two identifiers.   I discussed the limitations, risks, security and privacy concerns of performing an evaluation and management service by telephone and the availability of in person appointments. I also discussed with the patient that there may be a patient responsible charge related to this service. The patient expressed understanding and agreed to proceed.  Subjective:  Makayla White is a 24 y.o. G3P2002 at [redacted]w[redacted]d being followed for ongoing prenatal care.  She is currently monitored for the following issues for this low-risk pregnancy and has Supervision of other normal pregnancy, antepartum; Short interval between pregnancies affecting pregnancy, antepartum; History of prior pregnancy with SGA newborn; History of precipitous delivery; and Chlamydia infection affecting pregnancy in first trimester, antepartum on their problem list.  Patient reports pelvic pressure and backache. Reports fetal movement. Denies any contractions, bleeding or leaking of fluid.   The following portions of the patient's history were reviewed and updated as appropriate: allergies, current medications, past family history, past medical history, past social history, past surgical history and problem list.   Objective:   General:  Alert, oriented and cooperative.   Mental Status: Normal mood and affect perceived. Normal judgment and thought content.  Rest of physical exam deferred due to type of encounter  Assessment and Plan:  Pregnancy: G3P2002 at [redacted]w[redacted]d 1. Supervision of other normal pregnancy, antepartum Needs f/u US  Preterm labor symptoms and general obstetric precautions including but not limited to vaginal bleeding, contractions, leaking of fluid and fetal movement were reviewed in detail with the  patient.  I discussed the assessment and treatment plan with the patient. The patient was provided an opportunity to ask questions and all were answered. The patient agreed with the plan and demonstrated an understanding of the instructions. The patient was advised to call back or seek an in-person office evaluation/go to MAU at [facility] for any urgent or concerning symptoms. Please refer to After Visit Summary for other counseling recommendations.   I provided 11 minutes of non-face-to-face time during this encounter.  Return in about 3 weeks (around 03/18/2020) for 2 hr GTT.  No future appointments.  Scheryl Darter, MD Center for [facility], [facility]

## 2020-02-26 NOTE — Patient Instructions (Signed)

## 2020-02-26 NOTE — Progress Notes (Signed)
I connected with  Makayla White on 02/26/20 by a video enabled telemedicine application and verified that I am speaking with the correct person using two identifiers.   I discussed the limitations of evaluation and management by telemedicine. The patient expressed understanding and agreed to proceed.   ROB reports no problems today.

## 2020-02-27 ENCOUNTER — Ambulatory Visit (HOSPITAL_COMMUNITY)
Admission: RE | Admit: 2020-02-27 | Discharge: 2020-02-27 | Disposition: A | Discharge status: Home / Self Care | Payer: Medicaid Other | Source: Ambulatory Visit | Attending: Obstetrics | Admitting: Obstetrics

## 2020-02-27 ENCOUNTER — Ambulatory Visit (HOSPITAL_COMMUNITY): Payer: Medicaid Other | Admitting: *Deleted

## 2020-02-27 ENCOUNTER — Other Ambulatory Visit (HOSPITAL_COMMUNITY): Payer: Self-pay | Admitting: *Deleted

## 2020-02-27 ENCOUNTER — Encounter (HOSPITAL_COMMUNITY): Payer: Self-pay

## 2020-02-27 ENCOUNTER — Other Ambulatory Visit: Payer: Self-pay

## 2020-02-27 VITALS — BP 119/69 | HR 76 | Temp 97.7°F

## 2020-02-27 DIAGNOSIS — Z3A25 25 weeks gestation of pregnancy: Secondary | ICD-10-CM

## 2020-02-27 DIAGNOSIS — Z3480 Encounter for supervision of other normal pregnancy, unspecified trimester: Secondary | ICD-10-CM | POA: Insufficient documentation

## 2020-02-27 DIAGNOSIS — Z362 Encounter for other antenatal screening follow-up: Secondary | ICD-10-CM | POA: Diagnosis not present

## 2020-02-27 DIAGNOSIS — O09892 Supervision of other high risk pregnancies, second trimester: Secondary | ICD-10-CM | POA: Diagnosis not present

## 2020-02-27 DIAGNOSIS — O09292 Supervision of pregnancy with other poor reproductive or obstetric history, second trimester: Secondary | ICD-10-CM

## 2020-02-27 DIAGNOSIS — Z8759 Personal history of other complications of pregnancy, childbirth and the puerperium: Secondary | ICD-10-CM

## 2020-03-09 ENCOUNTER — Other Ambulatory Visit: Payer: Self-pay

## 2020-03-09 ENCOUNTER — Inpatient Hospital Stay (HOSPITAL_BASED_OUTPATIENT_CLINIC_OR_DEPARTMENT_OTHER): Payer: Medicaid Other

## 2020-03-09 ENCOUNTER — Inpatient Hospital Stay (HOSPITAL_COMMUNITY)
Admission: AD | Admit: 2020-03-09 | Discharge: 2020-03-11 | DRG: 806 | Disposition: A | Discharge status: Home / Self Care | Payer: Medicaid Other | Attending: Obstetrics and Gynecology | Admitting: Obstetrics and Gynecology

## 2020-03-09 ENCOUNTER — Encounter (HOSPITAL_COMMUNITY): Payer: Self-pay | Admitting: Obstetrics & Gynecology

## 2020-03-09 DIAGNOSIS — Z3A26 26 weeks gestation of pregnancy: Secondary | ICD-10-CM

## 2020-03-09 DIAGNOSIS — O9081 Anemia of the puerperium: Secondary | ICD-10-CM | POA: Diagnosis not present

## 2020-03-09 DIAGNOSIS — O99891 Other specified diseases and conditions complicating pregnancy: Secondary | ICD-10-CM

## 2020-03-09 DIAGNOSIS — A749 Chlamydial infection, unspecified: Secondary | ICD-10-CM

## 2020-03-09 DIAGNOSIS — D62 Acute posthemorrhagic anemia: Secondary | ICD-10-CM | POA: Diagnosis not present

## 2020-03-09 DIAGNOSIS — O4290 Premature rupture of membranes, unspecified as to length of time between rupture and onset of labor, unspecified weeks of gestation: Secondary | ICD-10-CM

## 2020-03-09 DIAGNOSIS — O9822 Gonorrhea complicating childbirth: Secondary | ICD-10-CM

## 2020-03-09 DIAGNOSIS — R109 Unspecified abdominal pain: Secondary | ICD-10-CM | POA: Diagnosis not present

## 2020-03-09 DIAGNOSIS — O42912 Preterm premature rupture of membranes, unspecified as to length of time between rupture and onset of labor, second trimester: Principal | ICD-10-CM | POA: Diagnosis present

## 2020-03-09 DIAGNOSIS — O4292 Full-term premature rupture of membranes, unspecified as to length of time between rupture and onset of labor: Secondary | ICD-10-CM | POA: Diagnosis not present

## 2020-03-09 DIAGNOSIS — O9832 Other infections with a predominantly sexual mode of transmission complicating childbirth: Secondary | ICD-10-CM

## 2020-03-09 DIAGNOSIS — Z20822 Contact with and (suspected) exposure to covid-19: Secondary | ICD-10-CM | POA: Diagnosis present

## 2020-03-09 DIAGNOSIS — D649 Anemia, unspecified: Secondary | ICD-10-CM | POA: Diagnosis not present

## 2020-03-09 DIAGNOSIS — Z3480 Encounter for supervision of other normal pregnancy, unspecified trimester: Secondary | ICD-10-CM

## 2020-03-09 DIAGNOSIS — O4690 Antepartum hemorrhage, unspecified, unspecified trimester: Secondary | ICD-10-CM

## 2020-03-09 DIAGNOSIS — O4692 Antepartum hemorrhage, unspecified, second trimester: Secondary | ICD-10-CM

## 2020-03-09 DIAGNOSIS — A549 Gonococcal infection, unspecified: Secondary | ICD-10-CM | POA: Diagnosis not present

## 2020-03-09 LAB — RAPID URINE DRUG SCREEN, HOSP PERFORMED
Amphetamines: NOT DETECTED
Barbiturates: NOT DETECTED
Benzodiazepines: NOT DETECTED
Cocaine: NOT DETECTED
Opiates: NOT DETECTED
Tetrahydrocannabinol: NOT DETECTED

## 2020-03-09 LAB — PREPARE RBC (CROSSMATCH)

## 2020-03-09 LAB — CBC
HCT: 28.3 % — ABNORMAL LOW (ref 36.0–46.0)
Hemoglobin: 8.7 g/dL — ABNORMAL LOW (ref 12.0–15.0)
MCH: 25.2 pg — ABNORMAL LOW (ref 26.0–34.0)
MCHC: 30.7 g/dL (ref 30.0–36.0)
MCV: 82 fL (ref 80.0–100.0)
Platelets: 362 10*3/uL (ref 150–400)
RBC: 3.45 MIL/uL — ABNORMAL LOW (ref 3.87–5.11)
RDW: 15.6 % — ABNORMAL HIGH (ref 11.5–15.5)
WBC: 13 10*3/uL — ABNORMAL HIGH (ref 4.0–10.5)
nRBC: 0 % (ref 0.0–0.2)

## 2020-03-09 LAB — RESPIRATORY PANEL BY RT PCR (FLU A&B, COVID)
Influenza A by PCR: NEGATIVE
Influenza B by PCR: NEGATIVE
SARS Coronavirus 2 by RT PCR: NEGATIVE

## 2020-03-09 LAB — RPR: RPR Ser Ql: NONREACTIVE

## 2020-03-09 LAB — ABO/RH: ABO/RH(D): A POS

## 2020-03-09 MED ORDER — BENZOCAINE-MENTHOL 20-0.5 % EX AERO: 1.0000 "application " | INHALATION_SPRAY | CUTANEOUS | Status: DC | PRN

## 2020-03-09 MED ORDER — COCONUT OIL OIL: 1.0000 "application " | TOPICAL_OIL | Status: DC | PRN

## 2020-03-09 MED ORDER — LACTATED RINGERS IV SOLN: 500.0000 mL | INTRAVENOUS | Status: DC | PRN

## 2020-03-09 MED ORDER — FENTANYL CITRATE (PF) 100 MCG/2ML IJ SOLN
100.0000 ug | INTRAMUSCULAR | Status: DC | PRN
  Administered 2020-03-09: 100 ug via INTRAVENOUS
  Filled 2020-03-09: qty 2

## 2020-03-09 MED ORDER — DIBUCAINE (PERIANAL) 1 % EX OINT: 1.0000 "application " | TOPICAL_OINTMENT | CUTANEOUS | Status: DC | PRN

## 2020-03-09 MED ORDER — MAGNESIUM SULFATE BOLUS VIA INFUSION
4.0000 g | Freq: Once | INTRAVENOUS | Status: AC
  Administered 2020-03-09: 4 g via INTRAVENOUS
  Filled 2020-03-09: qty 1000

## 2020-03-09 MED ORDER — ZOLPIDEM TARTRATE 5 MG PO TABS: 5.0000 mg | ORAL_TABLET | Freq: Every evening | ORAL | Status: DC | PRN

## 2020-03-09 MED ORDER — OXYTOCIN 40 UNITS IN NORMAL SALINE INFUSION - SIMPLE MED
2.5000 [IU]/h | INTRAVENOUS | Status: DC
  Filled 2020-03-09: qty 1000

## 2020-03-09 MED ORDER — SOD CITRATE-CITRIC ACID 500-334 MG/5ML PO SOLN: 30.0000 mL | ORAL | Status: DC | PRN

## 2020-03-09 MED ORDER — MAGNESIUM SULFATE 40 GM/1000ML IV SOLN
2.0000 g/h | INTRAVENOUS | Status: DC
  Administered 2020-03-09: 2 g/h via INTRAVENOUS

## 2020-03-09 MED ORDER — TETANUS-DIPHTH-ACELL PERTUSSIS 5-2.5-18.5 LF-MCG/0.5 IM SUSP: 0.5000 mL | Freq: Once | INTRAMUSCULAR | Status: DC

## 2020-03-09 MED ORDER — ONDANSETRON HCL 4 MG/2ML IJ SOLN: 4.0000 mg | INTRAMUSCULAR | Status: DC | PRN

## 2020-03-09 MED ORDER — OXYCODONE-ACETAMINOPHEN 5-325 MG PO TABS: 1.0000 | ORAL_TABLET | ORAL | Status: DC | PRN

## 2020-03-09 MED ORDER — BETAMETHASONE SOD PHOS & ACET 6 (3-3) MG/ML IJ SUSP
12.0000 mg | INTRAMUSCULAR | Status: DC
  Administered 2020-03-09: 12 mg via INTRAMUSCULAR
  Filled 2020-03-09: qty 5

## 2020-03-09 MED ORDER — WITCH HAZEL-GLYCERIN EX PADS: 1.0000 "application " | MEDICATED_PAD | CUTANEOUS | Status: DC | PRN

## 2020-03-09 MED ORDER — SODIUM CHLORIDE 0.9 % IV SOLN
500.0000 mg | Freq: Once | INTRAVENOUS | Status: AC
  Administered 2020-03-09: 500 mg via INTRAVENOUS
  Filled 2020-03-09: qty 500

## 2020-03-09 MED ORDER — NIFEDIPINE 10 MG PO CAPS: 20.0000 mg | ORAL_CAPSULE | ORAL | Status: DC | PRN

## 2020-03-09 MED ORDER — SODIUM CHLORIDE 0.9% IV SOLUTION: Freq: Once | INTRAVENOUS | Status: DC

## 2020-03-09 MED ORDER — LACTATED RINGERS IV SOLN: INTRAVENOUS | Status: DC

## 2020-03-09 MED ORDER — NIFEDIPINE 10 MG PO CAPS: 10.0000 mg | ORAL_CAPSULE | ORAL | Status: DC | PRN

## 2020-03-09 MED ORDER — METHYLERGONOVINE MALEATE 0.2 MG/ML IJ SOLN: 0.2000 mg | Freq: Once | INTRAMUSCULAR | Status: AC

## 2020-03-09 MED ORDER — CALCIUM CARBONATE ANTACID 500 MG PO CHEW: 2.0000 | CHEWABLE_TABLET | ORAL | Status: DC | PRN

## 2020-03-09 MED ORDER — SODIUM CHLORIDE 0.9 % IV SOLN
2.0000 g | Freq: Four times a day (QID) | INTRAVENOUS | Status: DC
  Administered 2020-03-09 – 2020-03-10 (×4): 2 g via INTRAVENOUS
  Filled 2020-03-09 (×4): qty 2000

## 2020-03-09 MED ORDER — LIDOCAINE HCL (PF) 1 % IJ SOLN: 30.0000 mL | INTRAMUSCULAR | Status: DC | PRN

## 2020-03-09 MED ORDER — MAGNESIUM SULFATE 40 GM/1000ML IV SOLN
INTRAVENOUS | Status: AC
  Filled 2020-03-09: qty 1000

## 2020-03-09 MED ORDER — SIMETHICONE 80 MG PO CHEW: 80.0000 mg | CHEWABLE_TABLET | ORAL | Status: DC | PRN

## 2020-03-09 MED ORDER — PRENATAL MULTIVITAMIN CH
1.0000 | ORAL_TABLET | Freq: Every day | ORAL | Status: DC
  Administered 2020-03-09 – 2020-03-10 (×2): 1 via ORAL
  Filled 2020-03-09 (×2): qty 1

## 2020-03-09 MED ORDER — LABETALOL HCL 5 MG/ML IV SOLN: 40.0000 mg | INTRAVENOUS | Status: DC | PRN

## 2020-03-09 MED ORDER — OXYCODONE-ACETAMINOPHEN 5-325 MG PO TABS: 2.0000 | ORAL_TABLET | ORAL | Status: DC | PRN

## 2020-03-09 MED ORDER — DOCUSATE SODIUM 100 MG PO CAPS
100.0000 mg | ORAL_CAPSULE | Freq: Every day | ORAL | Status: DC
  Administered 2020-03-09: 100 mg via ORAL
  Filled 2020-03-09: qty 1

## 2020-03-09 MED ORDER — SENNOSIDES-DOCUSATE SODIUM 8.6-50 MG PO TABS
2.0000 | ORAL_TABLET | ORAL | Status: DC
  Administered 2020-03-09: 2 via ORAL
  Filled 2020-03-09: qty 2

## 2020-03-09 MED ORDER — METHYLERGONOVINE MALEATE 0.2 MG/ML IJ SOLN
INTRAMUSCULAR | Status: AC
  Administered 2020-03-09: 0.2 mg via INTRAMUSCULAR
  Filled 2020-03-09: qty 1

## 2020-03-09 MED ORDER — DIPHENHYDRAMINE HCL 25 MG PO CAPS: 25.0000 mg | ORAL_CAPSULE | Freq: Four times a day (QID) | ORAL | Status: DC | PRN

## 2020-03-09 MED ORDER — PRENATAL MULTIVITAMIN CH: 1.0000 | ORAL_TABLET | Freq: Every day | ORAL | Status: DC

## 2020-03-09 MED ORDER — ACETAMINOPHEN 325 MG PO TABS
650.0000 mg | ORAL_TABLET | ORAL | Status: DC | PRN
  Administered 2020-03-09: 650 mg via ORAL
  Filled 2020-03-09: qty 2

## 2020-03-09 MED ORDER — SODIUM CHLORIDE 0.9 % IV SOLN: 500.0000 mg | INTRAVENOUS | Status: DC

## 2020-03-09 MED ORDER — ONDANSETRON HCL 4 MG PO TABS: 4.0000 mg | ORAL_TABLET | ORAL | Status: DC | PRN

## 2020-03-09 MED ORDER — ACETAMINOPHEN 325 MG PO TABS: 650.0000 mg | ORAL_TABLET | ORAL | Status: DC | PRN

## 2020-03-09 MED ORDER — OXYTOCIN BOLUS FROM INFUSION
500.0000 mL | Freq: Once | INTRAVENOUS | Status: AC
  Administered 2020-03-09: 500 mL via INTRAVENOUS

## 2020-03-09 MED ORDER — ONDANSETRON HCL 4 MG/2ML IJ SOLN: 4.0000 mg | Freq: Four times a day (QID) | INTRAMUSCULAR | Status: DC | PRN

## 2020-03-09 MED ORDER — IBUPROFEN 600 MG PO TABS
600.0000 mg | ORAL_TABLET | Freq: Four times a day (QID) | ORAL | Status: DC
  Administered 2020-03-09 – 2020-03-10 (×7): 600 mg via ORAL
  Filled 2020-03-09 (×8): qty 1

## 2020-03-09 NOTE — Progress Notes (Signed)
Patient ID: Makayla White, female   DOB: [birth year], 24 y.o.   MRN: [id number]   Patient presented to MAU by EMS and unit was notified of non-emergent transport. Patient arrived to MAU stating that she had sex this morning, went to use the bathroom and felt like she had to push like she was having a bowel movement but instead she experienced vaginal bleeding and a big gush of watery fluid. Provider visualized membranes protruding from vagina. Dr. Debroah Loop notified and came to bedside to check patient's cervix and confirm she was 4cm dilated and vertex. Quick bedside scan by Dr. Debroah Loop showed little to no fluid. [name], CNM notified patient would be transferred to L&D. NICU team notified. Patient transferred to L&D after formal bedside US.  [name], Odie Sera, NP  9:10 AM 03/09/2020

## 2020-03-09 NOTE — H&P (Signed)
Makayla White is a 24 y.o. female presenting for premature rupture of membranes at 110w4d [id number] . OB History    Gravida  3   Para  2   Term  2   Preterm  0   AB      Living  2     SAB      TAB      Ectopic      Multiple  0   Live Births  2          Past Medical History:  Diagnosis Date  . Anemia   . Anxiety   . Asthma   . Chlamydia   . Gonorrhea   . Scoliosis    Past Surgical History:  Procedure Laterality Date  . NOSE SURGERY    . NOSE SURGERY    . TONSILLECTOMY     Family History: family history includes Anxiety disorder in her mother; Arthritis in her mother; Depression in her mother; Drug abuse in her father; Healthy in her father; Kidney disease in her mother; Lupus in her mother. Social History:  reports that she has never smoked. She has never used smokeless tobacco. She reports that she does not drink alcohol or use drugs.     Maternal Diabetes: No Genetic Screening: Normal Maternal Ultrasounds/Referrals: Normal Fetal Ultrasounds or other Referrals:  None Maternal Substance Abuse:  No Significant Maternal Medications:  None Significant Maternal Lab Results:  None Other Comments:  Magnesium sulfate, IV abx, betamethasone  Review of Systems History Dilation: 4 Exam by:: Dr. Debroah Loop Blood pressure (!) 111/52, pulse 76, temperature 98.8 F (37.1 C), temperature source Oral, resp. rate 18, last menstrual period 09/02/2019, SpO2 100 %, unknown if currently breastfeeding. Exam Physical Exam  Prenatal labs: ABO, Rh: A/Positive/-- (12/30 1445) Antibody: Negative (12/30 1445) Rubella: 2.47 (12/30 1445) RPR: Non Reactive (12/30 1445)  HBsAg: Negative (12/30 1445)  HIV: Non Reactive (12/30 1445)  GBS:     Assessment/Plan: Premature rupture of membranes at [redacted]w[redacted]d IV antibiotics Magnesium sulfate for IVH prophylaxis Betamethasone NICU consult   Scheryl Darter 03/09/2020, 9:23 AM

## 2020-03-09 NOTE — Lactation Note (Signed)
This note was copied from a baby's chart. Lactation Consultation Note  Patient Name: Makayla White [id number] Date: 03/09/2020 Reason for consult: Initial assessment;Infant < 6lbs;NICU baby;Preterm <34wks  Visited with mom of a 29 hours old pre-term NICU female; mom was visiting baby in the NICU, [facility] consult took place in baby's room. Mom is a P3, she BF her first baby for 6 months and her 2nd one for one month. She participated in the [facility] program at the [facility] and she's already familiar with hand expression and able to see colostrum when doing so. Mom told LC that she's been leaking colostrum since she was [redacted] weeks pregnant.   She started pumping today, but she's only done it twice. Explained to mom the importance of consistent pumping while baby is in the NICU, because the purpose of pumping early on is mainly for breast stimulation, that fact that she's already getting drops/volume is just and added bonus, praised her for her efforts. Reviewed benefits of premature milk, pumping schedule, pumping log and breastmilk storage guidelines for NICU babies.  Feeding plan:  1. Encouraged mom to start pumping every 2-3 hours, but not going more than 6 hours without pumping at night 2. Mom will start labeling her breastmilk and turning it in to baby's NICU RN, breastmilk labels provided during [facility] consultation   BF brochure, BF resources and NICU booklet were reviewed. No support person for mom present in baby's room at the time of [facility] consultation. Mom reported all questions and concerns were answered, she's aware of LC OP services and will call PRN.   Maternal Data Formula Feeding for Exclusion: No Has patient been taught Hand Expression?: Yes Does the patient have breastfeeding experience prior to this delivery?: Yes  Feeding    LATCH Score                   Interventions Interventions: Breast feeding basics reviewed;DEBP  Lactation Tools Discussed/Used Tools:  Pump Breast pump type: Double-Electric Breast Pump WIC Program: Yes Pump Review: Setup, frequency, and cleaning;Milk Storage Initiated by:: RN and MPeck (breastmilk storage guidelines) Date initiated:: 03/09/20   Consult Status Consult Status: PRN    Irene Collings S Genine Beckett 03/09/2020, 10:29 PM

## 2020-03-09 NOTE — MAU Note (Addendum)
Makayla White is a 24 y.o. at [redacted]w[redacted]d here in MAU reporting:  Via EMS for possible rupture of membranes +lower abdominal pain Pressure like and constant as if she feels like she needs to have a BM Onset of complaint: started this morning following intercourse Pain score: 7/10  FHT:150   NP called to bedside upon arrival.

## 2020-03-10 LAB — CBC
HCT: 26.7 % — ABNORMAL LOW (ref 36.0–46.0)
Hemoglobin: 8.1 g/dL — ABNORMAL LOW (ref 12.0–15.0)
MCH: 25.9 pg — ABNORMAL LOW (ref 26.0–34.0)
MCHC: 30.3 g/dL (ref 30.0–36.0)
MCV: 85.3 fL (ref 80.0–100.0)
Platelets: 326 10*3/uL (ref 150–400)
RBC: 3.13 MIL/uL — ABNORMAL LOW (ref 3.87–5.11)
RDW: 15.7 % — ABNORMAL HIGH (ref 11.5–15.5)
WBC: 18.5 10*3/uL — ABNORMAL HIGH (ref 4.0–10.5)
nRBC: 0.1 % (ref 0.0–0.2)

## 2020-03-10 MED ORDER — SENNOSIDES-DOCUSATE SODIUM 8.6-50 MG PO TABS: 2.0000 | ORAL_TABLET | Freq: Every evening | ORAL | Status: DC | PRN

## 2020-03-10 NOTE — Lactation Note (Signed)
This note was copied from a baby's chart. Lactation Consultation Note  Patient Name: Girl Judeth Gilles [id number] Date: 03/10/2020  Follow up visit with mom.  Tubing on floor when entered room.  Mom on cell phone.  Continued to be on cell phone duration of consult. Mom reports she has a DEBP for home use that dad bought her but she does not know what brand it is. Mom is a [facility] participant as well in [facility]  [facility] referral sent.   Urged to pump 8-12 times day for 15 minutes to establish a good supply for later.  Add massage and hand expression to pumping.  Call lactation as needed.    Maternal Data    Feeding    LATCH Score                   Interventions    Lactation Tools Discussed/Used     Consult Status      Marley Pakula Michaelle Copas 03/10/2020, 3:13 PM

## 2020-03-10 NOTE — Progress Notes (Signed)
Post Partum Day 1 Subjective: no complaints  Objective: Blood pressure 116/68, pulse 70, temperature 97.6 F (36.4 C), temperature source Oral, resp. rate 20, height 5\' 5"  (1.651 m), weight 66.3 kg, last menstrual period 09/02/2019, SpO2 99 %, unknown if currently breastfeeding.  Physical Exam:  General: alert, cooperative and no distress Lochia: appropriate Uterine Fundus: firm DVT Evaluation: No evidence of DVT seen on physical exam.  Recent Labs    03/09/20 0845 03/10/20 0647  HGB 8.7* 8.1*  HCT 28.3* 26.7*    Assessment/Plan: Contraception IUD Baby in NICU   LOS: 1 day   03/12/20 03/10/2020, 7:21 AM

## 2020-03-11 ENCOUNTER — Encounter (HOSPITAL_COMMUNITY): Payer: Self-pay | Admitting: Obstetrics & Gynecology

## 2020-03-11 DIAGNOSIS — D649 Anemia, unspecified: Secondary | ICD-10-CM | POA: Diagnosis not present

## 2020-03-11 MED ORDER — POLYSACCHARIDE IRON COMPLEX 150 MG PO CAPS
150.0000 mg | ORAL_CAPSULE | Freq: Every day | ORAL | Status: DC
  Administered 2020-03-11: 150 mg via ORAL
  Filled 2020-03-11: qty 1

## 2020-03-11 MED ORDER — IBUPROFEN 600 MG PO TABS: 600.0000 mg | ORAL_TABLET | Freq: Four times a day (QID) | ORAL | 0 refills | Status: DC | PRN

## 2020-03-11 MED ORDER — POLYSACCHARIDE IRON COMPLEX 150 MG PO CAPS: 150.0000 mg | ORAL_CAPSULE | Freq: Every day | ORAL | 0 refills | Status: DC

## 2020-03-11 MED ORDER — DOCUSATE SODIUM 100 MG PO CAPS: 100.0000 mg | ORAL_CAPSULE | Freq: Two times a day (BID) | ORAL | 1 refills | Status: DC | PRN

## 2020-03-11 MED ORDER — ACETAMINOPHEN 325 MG PO TABS: 650.0000 mg | ORAL_TABLET | ORAL | Status: DC | PRN

## 2020-03-11 NOTE — Clinical Social Work Maternal (Signed)
CLINICAL SOCIAL WORK MATERNAL/CHILD NOTE  Patient Details  Name: Makayla White MRN: [id number] Date of Birth: 03/22/1996  Date:  03/11/2020  Clinical Social Worker Initiating Note:  Joelene Millin Chosen Garron Date/Time: Initiated:  03/11/20/1001     Child's Name:  Makayla White   Biological Parents:  Mother   Need for Interpreter:  None   Reason for Referral:  Parental Support of Premature Babies < 75 weeks/or Critically Ill babies, Behavioral Health Concerns   Address:  [place] [address]    Phone number:  [phone] (home)     Additional phone number:   Household Members/Support Persons (HM/SP):   Household Member/Support Person 1, Household Member/Support Person 2   HM/SP Name Relationship DOB or Age  HM/SP -1 Malachi Figaro-Smalls son 02/19/2017  HM/SP -2 Anahi [place] daughter 08/04/2018  HM/SP -3        HM/SP -4        HM/SP -5        HM/SP -6        HM/SP -7        HM/SP -8          Natural Supports (not living in the home):  Extended Family, Artist Supports:     Employment: Unemployed   Type of Work:     Education:  Programmer, systems   Homebound arranged:    Museum/gallery curator Resources:  [employer]   Other Resources:  [employer], Physicist, medical    Cultural/Religious Considerations Which May Impact Care:    Strengths:  Ability to meet basic needs , Engineer, materials, Understanding of illness   Psychotropic Medications:         Pediatrician:    [place] area  Pediatrician List:   [facility] for [place]      Pediatrician Fax Number:    Risk Factors/Current Problems:  None   Cognitive State:  Able to Concentrate , Alert , Goal Oriented , Insightful , Linear Thinking    Mood/Affect:  Interested , Calm    CSW Assessment: CSW met with MOB at infant's bedside to discuss behavioral health  concerns (edinburgh score 16) and infant's NICU admission. CSW introduced self and explained reason for consult. MOB was welcoming, open and remained engaged during assessment. MOB reported that she resides with her two older children and is currently unemployed. MOB reported that she receives both [facility] and food stamps. [facility] staff came in and informed MOB that they will provide requested items closer to infant's discharge then exited room. MOB reported that she already has a car seat and [employer] will be providing a pack and play with basinet for safe sleep. CSW inquired about MOB's support system, MOB reported that her mom and cousins are her supports. MOB reported that her mom is currently staying with her to assist with caring for her children. CSW positively affirmed MOB having supports to assist with her older children.   CSW inquired about MOB's mental health history. MOB reported that she was diagnosed with anxiety and depression in 2018. MOB reported that she is not currently taking any medication or experiencing any anxiety or depression symptoms. MOB reported that she last experienced symptoms last month (crying for no reason). CSW asked MOB if she was participating in any therapy, MOB reported no but that she needs  to be. CSW inquired about MOB's barriers to participating in therapy, MOB reported that transportation was a barrier. CSW provided MOB with local mental health resources and discussed virtual therapy options, MOB seemed interested. CSW and MOB discussed MOB's edinburgh score 16. MOB reported that she was experiencing multiple stressors during pregnancy including trying to confirm paternity, losing employment and caring for her 2 other children. CSW acknowledged and validated MOB's feelings surrounding her stressors. MOB attributed her high edinburgh score to her stressors. CSW asked if MOB was experiencing financial stressors due to losing her job, MOB reported  no that her supports are helping her. CSW encouraged MOB to notify CSW if any financial stressors arise and informed MOB about financial assistance programs.  CSW inquired about how MOB was feeling emotionally after giving birth, MOB reported that she feeling happy, sad and angry. MOB reported that she feels she gave birth early due to stress. CSW actively listened and provided emotional support. MOB presented calm and did not demonstrate any acute mental health signs/symptoms. CSW assessed for safety, MOB denied SI, HI and domestic violence.   CSW provided education regarding the baby blues period vs. perinatal mood disorders, discussed treatment and gave resources for mental health follow up if concerns arise.  CSW recommends self-evaluation during the postpartum time period using the New Mom Checklist from Postpartum Progress and encouraged MOB to contact a medical professional if symptoms are noted at any time.    CSW provided review of Sudden Infant Death Syndrome (SIDS) precautions.    CSW and MOB discussed infant's NICU admission. CSW informed MOB about the NICU, what to expect and resources/supports available while infant is admitted to the NICU. MOB reported that she feels well informed about infant's care and that staff has been answering all questions asked. MOB reported that meal vouchers would be helpful, CSW agreed to provide. MOB reported that she would utilize medicaid transportation to come and visit infant and denied any additional transportation barriers. MOB denied any additional needs/concerns regarding the NICU. CSW informed MOB that infant qualifies to apply for SSI benefits and explained the application process, MOB reported that she was familiar with SSI benefits. CSW provided information about SSI.    CSW will continue to offer resources/supports while infant is admitted to the NICU.   CSW Plan/Description:  Sudden Infant Death Syndrome (SIDS) Education, Perinatal Mood and Anxiety  Disorder (PMADs) Education, Other Patient/Family Education, Other Information/Referral to [employer], [place] (SSI) Information    Burnis Medin, LCSW 03/11/2020, 10:26 AM

## 2020-03-11 NOTE — Discharge Instructions (Signed)
Vaginal Delivery, Care After Refer to this sheet in the next few weeks. These discharge instructions provide you with information on caring for yourself after delivery. Your caregiver may also give you specific instructions. Your treatment has been planned according to the most current medical practices available, but problems sometimes occur. Call your caregiver if you have any problems or questions after you go home. HOME CARE INSTRUCTIONS 1. Take over-the-counter or prescription medicines only as directed by your caregiver or pharmacist. 2. Do not drink alcohol, especially if you are breastfeeding or taking medicine to relieve pain. 3. Do not smoke tobacco. 4. Continue to use good perineal care. Good perineal care includes: 1. Wiping your perineum from back to front 2. Keeping your perineum clean. 3. You can do sitz baths twice a day, to help keep this area clean 5. Do not use tampons, douche or have sex until your caregiver says it is okay. 6. Shower only and avoid sitting in submerged water, aside from sitz baths 7. Wear a well-fitting bra that provides breast support. 8. Eat healthy foods. 9. Drink enough fluids to keep your urine clear or pale yellow. 10. Eat high-fiber foods such as whole grain cereals and breads, brown rice, beans, and fresh fruits and vegetables every day. These foods may help prevent or relieve constipation. 11. Avoid constipation with high fiber foods or medications, such as miralax or metamucil 12. Follow your caregiver's recommendations regarding resumption of activities such as climbing stairs, driving, lifting, exercising, or traveling. 13. Talk to your caregiver about resuming sexual activities. Resumption of sexual activities is dependent upon your risk of infection, your rate of healing, and your comfort and desire to resume sexual activity. 14. Try to have someone help you with your household activities and your newborn for at least a few days after you leave  the hospital. 15. Rest as much as possible. Try to rest or take a nap when your newborn is sleeping. 16. Increase your activities gradually. 17. Keep all of your scheduled postpartum appointments. It is very important to keep your scheduled follow-up appointments. At these appointments, your caregiver will be checking to make sure that you are healing physically and emotionally. SEEK MEDICAL CARE IF:   You are passing large clots from your vagina. Save any clots to show your caregiver.  You have a foul smelling discharge from your vagina.  You have trouble urinating.  You are urinating frequently.  You have pain when you urinate.  You have a change in your bowel movements.  You have increasing redness, pain, or swelling near your vaginal incision (episiotomy) or vaginal tear.  You have pus draining from your episiotomy or vaginal tear.  Your episiotomy or vaginal tear is separating.  You have painful, hard, or reddened breasts.  You have a severe headache.  You have blurred vision or see spots.  You feel sad or depressed.  You have thoughts of hurting yourself or your newborn.  You have questions about your care, the care of your newborn, or medicines.  You are dizzy or light-headed.  You have a rash.  You have nausea or vomiting.  You were breastfeeding and have not had a menstrual period within 12 weeks after you stopped breastfeeding.  You are not breastfeeding and have not had a menstrual period by the 12th week after delivery.  You have a fever. SEEK IMMEDIATE MEDICAL CARE IF:   You have persistent pain.  You have chest pain.  You have shortness of breath.    You faint.  You have leg pain.  You have stomach pain.  Your vaginal bleeding saturates two or more sanitary pads in 1 hour. MAKE SURE YOU:   Understand these instructions.  Will watch your condition.  Will get help right away if you are not doing well or get worse. Document Released:  11/06/2000 Document Revised: 03/26/2014 Document Reviewed: 07/06/2012 ExitCare Patient Information 2015 [employer]. This information is not intended to replace advice given to you by your health care provider. Make sure you discuss any questions you have with your health care provider.  Sitz Bath A sitz bath is a warm water bath taken in the sitting position. The water covers only the hips and butt (buttocks). We recommend using one that fits in the toilet, to help with ease of use and cleanliness. It may be used for either healing or cleaning purposes. Sitz baths are also used to relieve pain, itching, or muscle tightening (spasms). The water may contain medicine. Moist heat will help you heal and relax.  HOME CARE  Take 3 to 4 sitz baths a day. 18. Fill the bathtub half-full with warm water. 19. Sit in the water and open the drain a little. 20. Turn on the warm water to keep the tub half-full. Keep the water running constantly. 21. Soak in the water for 15 to 20 minutes. 22. After the sitz bath, pat the affected area dry. GET HELP RIGHT AWAY IF: You get worse instead of better. Stop the sitz baths if you get worse. MAKE SURE YOU:  Understand these instructions.  Will watch your condition.  Will get help right away if you are not doing well or get worse. Document Released: 12/17/2004 Document Revised: 08/03/2012 Document Reviewed: 03/09/2011 ExitCare Patient Information 2015 ExitCare, LLC. This information is not intended to replace advice given to you by your health care provider. Make sure you discuss any questions you have with your health care provider.    

## 2020-03-11 NOTE — Discharge Summary (Signed)
Postpartum Discharge Summary      Patient Name: Makayla White DOB: 1996/03/09 MRN: [id number]  Date of admission: 03/09/2020 Delivering Provider: [place] Mode   Date of discharge: 03/11/2020  Admitting diagnosis: Pregnancy at 26/4. PPROM at 28/4. Preterm labor Secondary diagnosis:  History of chlamydia (no test of cure done)     Discharge diagnosis: Preterm Pregnancy Delivered. Anemia (asymptomatic, acute blood loss due to delivery)                                                                                                Post partum procedures:None  Augmentation: None  Complications: None  Hospital course: Patient presented to [facility] via EMS with s/s of PPROM and diagnosed with PPROM and preterm labor. She subsequently delivered on HD#1 via vaginal delivery; her perineum was intact. She had an uncomplicated PP course and was discharged to home on PPD#2. Patient undecided on contraception and wanted to wait until her two week follow up visit  Patient asymptomatic on anemia and sent home on qday iron.   Magnesium Sulfate received: Yes BMZ received: Yes Rhophylac:N/A  Physical exam  Vitals:   03/10/20 0812 03/10/20 1622 03/10/20 1942 03/10/20 2254  BP: (!) 110/54 113/60 (!) 116/59 119/60  Pulse: 80 82 77 75  Resp: '18 16 18 18  ' Temp: 99.3 F (37.4 C) 98.2 F (36.8 C) 98.6 F (37 C) 98.4 F (36.9 C)  TempSrc: Oral Oral Oral Oral  SpO2: 100% 100% 100% 100%  Weight:      Height:       General: alert Lochia: appropriate Uterine Fundus: soft, nttp, firm Extremities: no c/c/e  Labs: Conflict (See Lab Report): A POS/A POS Performed at [facility], [place] [address] [place], [place] [address]  RPR negative  CBC Latest Ref Rng & Units 03/10/2020 03/09/2020 11/22/2019  WBC 4.0 - 10.5 K/uL 18.5(H) 13.0(H) 12.0(H)  Hemoglobin 12.0 - 15.0 g/dL 8.1(L) 8.7(L) 10.3(L)  Hematocrit 36.0 - 46.0 % 26.7(L) 28.3(L) 31.1(L)  Platelets 150 - 400 K/uL 326 362 445     CMP Latest Ref Rng & Units 10/16/2019  Glucose 70 - 99 mg/dL 119(H)  BUN 6 - 20 mg/dL 7  Creatinine 0.44 - 1.00 mg/dL 0.67  Sodium 135 - 145 mmol/L 138  Potassium 3.5 - 5.1 mmol/L 3.9  Chloride 98 - 111 mmol/L 108  CO2 22 - 32 mmol/L 23  Calcium 8.9 - 10.3 mg/dL 9.2  Total Protein 6.5 - 8.1 g/dL 7.4  Total Bilirubin 0.3 - 1.2 mg/dL 0.3  Alkaline Phos 38 - 126 U/L 93  AST 15 - 41 U/L 17  ALT 0 - 44 U/L 10   Edinburgh Score: Edinburgh Postnatal Depression Scale Screening Tool 03/10/2020  I have been able to laugh and see the funny side of things. 1  I have looked forward with enjoyment to things. 2  I have blamed myself unnecessarily when things went wrong. 1  I have been anxious or worried for no good reason. 3  I have felt scared or panicky for no good reason. 3  Things have been  getting on top of me. 2  I have been so unhappy that I have had difficulty sleeping. 2  I have felt sad or miserable. 1  I have been so unhappy that I have been crying. 1  The thought of harming myself has occurred to me. 0  Edinburgh Postnatal Depression Scale Total 16    Discharge instruction: per After Visit Summary and "Baby and Me Booklet".  After visit meds:  Allergies as of 03/11/2020   No Known Allergies     Medication List    STOP taking these medications   Doxylamine-Pyridoxine 10-10 MG Tbec     TAKE these medications   acetaminophen 325 MG tablet Commonly known as: Tylenol Take 2 tablets (650 mg total) by mouth every 4 (four) hours as needed (for pain scale < 4).   Blood Pressure Kit Devi 1 kit by Does not apply route once a week. Check Blood Pressure regularly and record readings into the Babyscripts App.  Large Cuff.  DX O90.0   [place] as directed.   docusate sodium 100 MG capsule Commonly known as: COLACE Take 1 capsule (100 mg total) by mouth 2 (two) times daily as needed.   ibuprofen 600 MG tablet Commonly known as: ADVIL Take 1  tablet (600 mg total) by mouth every 6 (six) hours as needed.   iron polysaccharides 150 MG capsule Commonly known as: NIFEREX Take 1 capsule (150 mg total) by mouth daily.   prenatal multivitamin Tabs tablet Take 1 tablet by mouth daily at 12 noon.   promethazine 25 MG tablet Commonly known as: PHENERGAN Take 1 tablet (25 mg total) by mouth every 6 (six) hours as needed for nausea or vomiting.       Diet: routine diet  Activity: Advance as tolerated. Pelvic rest for 6 weeks.   Outpatient follow up:2 weeks Follow up Appt: Future Appointments  Date Time Provider [place]  03/18/2020  9:15 AM [facility] None  03/18/2020  9:30 AM Constant, Peggy, MD [facility] None  03/27/2020  9:15 AM [facility] NURSE [facility]-US  03/27/2020  9:15 AM [facility] [place] 4 [facility]FCUS M[facility]-US   Follow up Visit: [place]. Go in 2 week(s).   Specialty: Obstetrics and Gynecology Why: call on Wednesday if you haven't heard about your 2 week after baby visit Contact information: [address], [place] [phone]          Newborn Data: Live born female  Birth Weight: 2 lb 4 oz (1020 g) APGAR: 3, 7  Newborn Delivery   Birth date/time: 03/09/2020 10:54:00 Delivery type: Vaginal, Spontaneous      Baby Feeding: Breast Disposition:NICU   03/11/2020 Aletha Halim, MD

## 2020-03-11 NOTE — Progress Notes (Signed)
Discharge instructions given to patient. Discussed postpartum care, med changes, follow up appointments, PPD and baby blues.   Patient verbalized understanding.

## 2020-03-12 LAB — TYPE AND SCREEN
ABO/RH(D): A POS
Antibody Screen: NEGATIVE
Unit division: 0
Unit division: 0

## 2020-03-12 LAB — BPAM RBC
Blood Product Expiration Date: 202105072359
Blood Product Expiration Date: 202105072359
ISSUE DATE / TIME: 202104130703
ISSUE DATE / TIME: 202104130703
Unit Type and Rh: 6200
Unit Type and Rh: 6200

## 2020-03-12 LAB — SURGICAL PATHOLOGY

## 2020-03-14 ENCOUNTER — Ambulatory Visit: Payer: Self-pay

## 2020-03-14 NOTE — Lactation Note (Signed)
This note was copied from a baby's chart. Lactation Consultation Note  Patient Name: Makayla White Date: 03/14/2020 Reason for consult: Follow-up assessment;NICU baby;Preterm <34wks;Infant < 6lbs  1453 - 1501 - I followed up with Makayla White. She was holding her daughter, [name] upon entry. She report that she is pumping regularly including at night. She states that with her personal pump at home, she is not yielding a lot of milk. She is scheduled to get a [facility] loaner pump tomorrow (4/23).   Ms. Aguinaldo has a symphony pump in the room. She states that she typically stays 3-4 hours a day with [facility]. She did not have her kit with her. I encouraged her to bring her kit so she can pump when she arrives and just before she leaves. I offered to supply her with a kit today, but she states that she has to leave shortly to pick up her other kids. She states that she will try to bring her kit with her next time. I emphasized that this might be a good time to pump because time with baby will promote milk production.  I encouraged Makayla White to pump 8 times a day (every 3 hours approximately) including at night. She states that she has been getting up at night to pump.  I stated that lactation would follow up in a few days to see how things were going with the [facility] pump at home.   Interventions Interventions: Breast feeding basics reviewed  Lactation Tools Discussed/Used Breast pump type: Double-Electric Breast Pump   Consult Status Consult Status: Follow-up Date: 03/17/20 Follow-up type: In-patient    Lenore Manner 03/14/2020, 3:03 PM

## 2020-03-18 ENCOUNTER — Ambulatory Visit (INDEPENDENT_AMBULATORY_CARE_PROVIDER_SITE_OTHER): Payer: Medicaid Other | Admitting: Licensed Clinical Social Worker

## 2020-03-18 ENCOUNTER — Other Ambulatory Visit: Payer: Medicaid Other

## 2020-03-18 ENCOUNTER — Encounter: Payer: Medicaid Other | Admitting: Obstetrics and Gynecology

## 2020-03-18 DIAGNOSIS — F53 Postpartum depression: Secondary | ICD-10-CM

## 2020-03-18 DIAGNOSIS — O99345 Other mental disorders complicating the puerperium: Secondary | ICD-10-CM

## 2020-03-18 DIAGNOSIS — Z658 Other specified problems related to psychosocial circumstances: Secondary | ICD-10-CM

## 2020-03-18 NOTE — BH Specialist Note (Signed)
Integrated Behavioral Health via Telemedicine Video Visit  03/18/2020 Makayla White [id number]  Number of Integrated Behavioral Health visits: 1 Session Start time: 8:30am Session End time: 9:03pm Total time: 33 mins   Referring Provider: In patient hosp referral  Type of Visit: Video Patient/Family location: Patient residence  [facility] Provider location: [facility]  All persons participating in visit: CSW A. Felton Clinton and [employer]   Confirmed patient's address: yes Confirmed patient's phone number: yes  Any changes to demographics: no   Confirmed patient's insurance: no Any changes to patient's insurance: n/a  Discussed confidentiality: yes  I connected with Senita Smalls-Muckle  by a video enabled telemedicine application and verified that I am speaking with the correct person using two identifiers.     I discussed the limitations of evaluation and management by telemedicine and the availability of in person appointments.  I discussed that the purpose of this visit is to provide behavioral health care while limiting exposure to the novel coronavirus.   Discussed there is a possibility of technology failure and discussed alternative modes of communication if that failure occurs.  I discussed that engaging in this video visit, they consent to the provision of behavioral healthcare and the services will be billed under their insurance.  Patient and/or legal guardian expressed understanding and consented to video visit: yes  PRESENTING CONCERNS: Patient and/or family reports the following  symptoms/concerns: feelings of guilt, depression  Duration of problem: Since delivery; Severity of problem: mild  STRENGTHS (Protective Factors/Coping Skills): Supportive family  Safe and stable housing  GOALS ADDRESSED: Patient will: 1.  Reduce symptoms of: feeling of guilt, worry and overwhelmed   2.  Increase knowledge of diagnosis and status of newborn 3.   Demonstrate ability to: self manage symptoms   INTERVENTIONS: Interventions utilized:  Supportive counseling  Standardized Assessments completed: none  ASSESSMENT: Patient currently experiencing adjustment disorder w depressed mood   Patient may benefit from integrated behavioral health   PLAN: 1. Follow up with behavioral health clinician on : 03/27/2020 2. Behavioral recommendations: Break up tasks into smaller segments to prevent burnout, use age appropriate learning material to communicate with two older children about newborn nicu stay, prioritize sleep  3. Referral(s): none at this time   I discussed the assessment and treatment plan with the patient and/or parent/guardian. They were provided an opportunity to ask questions and all were answered. They agreed with the plan and demonstrated an understanding of the instructions.   They were advised to call back or seek an in-person evaluation if the symptoms worsen or if the condition fails to improve as anticipated.  Gwyndolyn Saxon

## 2020-03-27 ENCOUNTER — Encounter: Payer: Self-pay | Admitting: Obstetrics and Gynecology

## 2020-03-27 ENCOUNTER — Other Ambulatory Visit: Payer: Self-pay

## 2020-03-27 ENCOUNTER — Encounter (HOSPITAL_COMMUNITY): Payer: Self-pay

## 2020-03-27 ENCOUNTER — Ambulatory Visit (HOSPITAL_COMMUNITY): Payer: Medicaid Other

## 2020-03-27 ENCOUNTER — Ambulatory Visit (HOSPITAL_BASED_OUTPATIENT_CLINIC_OR_DEPARTMENT_OTHER): Payer: Medicaid Other | Admitting: Obstetrics and Gynecology

## 2020-03-27 DIAGNOSIS — O99345 Other mental disorders complicating the puerperium: Secondary | ICD-10-CM

## 2020-03-27 DIAGNOSIS — Z3042 Encounter for surveillance of injectable contraceptive: Secondary | ICD-10-CM

## 2020-03-27 DIAGNOSIS — F53 Postpartum depression: Secondary | ICD-10-CM

## 2020-03-27 MED ORDER — MEDROXYPROGESTERONE ACETATE 150 MG/ML IM SUSP
150.0000 mg | Freq: Once | INTRAMUSCULAR | Status: AC
  Administered 2020-03-27: 16:00:00 150 mg via INTRAMUSCULAR

## 2020-03-27 MED ORDER — SERTRALINE HCL 50 MG PO TABS: 50.0000 mg | ORAL_TABLET | Freq: Every day | ORAL | 3 refills | Status: DC

## 2020-03-27 NOTE — Progress Notes (Signed)
    Post Partum Visit Note  Makayla White is a 24 y.o. [phone] female who presents for a postpartum visit. She is 2 weeks postpartum following a normal spontaneous vaginal delivery.  I have fully reviewed the prenatal and intrapartum course. The delivery was at 26 gestational weeks secondary to PPROM with PTD.  Anesthesia: none. Postpartum course has been uncomplicated thus far. Baby is doing well still in NICU. Baby is feeding by breast. Bleeding no bleeding. Bowel function is normal. Bladder function is normal. Patient is sexually active. Contraception method is condoms. Postpartum depression screening: positive.     Review of Systems Pertinent items noted in HPI and remainder of comprehensive ROS otherwise negative.    Objective:  Blood pressure 113/71, pulse 73, weight 138 lb (62.6 kg), last menstrual period 09/02/2019, unknown if currently breastfeeding.  General:  alert, cooperative and no distress  Lungs: clear to auscultation bilaterally  Heart:  regular rate and rhythm  Abdomen: soft, non-tender; bowel sounds normal; no masses,  no organomegaly        Assessment:    Normal postpartum exam. Pap smear not done at today's visit.   Plan:   Essential components of care per ACOG recommendations:  1.  Mood and well being: Patient with positive depression screening today. Reviewed local resources for support.  - Patient scheduled to see integrated behavioral health - Discussed medical management with Zoloft and patient agreed with medical management  2. Infant care and feeding:  -Patient currently breastmilk feeding? Yes   3. Sexuality, contraception and birth spacing - Patient does not want a pregnancy in the next year.  - Reviewed forms of contraception in tiered fashion. Patient desired Depo-Provera today.   - Discussed birth spacing of 18 months  4. Sleep and fatigue -Encouraged family/partner/community support of 4 hrs of uninterrupted sleep to help with mood  and fatigue  5. Physical Recovery  - Discussed patients delivery and complications - Patient is safe to resume physical and sexual activity  6.  Health Maintenance - Last pap smear done 01/2019 and was normal with negative HPV.  7.RTC in 4 weeks for postpartum visit

## 2020-04-14 ENCOUNTER — Ambulatory Visit: Payer: Self-pay

## 2020-04-14 NOTE — Lactation Note (Signed)
This note was copied from a baby's chart. Lactation Consultation Note  Patient Name: Girl Rosalynd Mcwright [id number] Date: 04/14/2020    Follow up call to Ms. Smalls-Muckle. She will be visiting her daughter on Monday 5/24, and she states that 10:00 am would be a good time for lactation to visit. Check on progress with pumping.  Consult Status Consult Status: Follow-up Date: 04/15/20 Follow-up type: In-patient    Walker Shadow 04/14/2020, 1:48 PM

## 2020-04-23 ENCOUNTER — Ambulatory Visit: Payer: Medicaid Other | Admitting: Licensed Clinical Social Worker

## 2020-04-23 ENCOUNTER — Ambulatory Visit: Payer: Medicaid Other | Admitting: Obstetrics & Gynecology

## 2020-04-23 NOTE — BH Specialist Note (Deleted)
Pt no show

## 2020-05-10 ENCOUNTER — Ambulatory Visit: Payer: Self-pay

## 2020-05-10 NOTE — Lactation Note (Signed)
This note was copied from a baby's chart. Lactation Consultation Note  Patient Name: Makayla White [id number] Date: 05/10/2020 Reason for consult: Follow-up assessment  1200 - Lactation followed up with Makayla White today while doing rounds. She indicated that she is no longer pumping and formula feeding. I verbalized understanding and let her know that lactation is available to her anytime, and she could call or ask her RN to call if she needs anything. I stated that I would put her in our system as "complete."   Feeding Feeding Type: Formula    Consult Status Consult Status: Complete    Walker Shadow 05/10/2020, 12:43 PM

## 2020-06-11 ENCOUNTER — Ambulatory Visit: Payer: Medicaid Other | Admitting: Licensed Clinical Social Worker

## 2020-06-11 ENCOUNTER — Ambulatory Visit: Payer: Medicaid Other | Admitting: Family Medicine

## 2020-06-11 NOTE — BH Specialist Note (Deleted)
No show

## 2020-08-01 ENCOUNTER — Ambulatory Visit (HOSPITAL_COMMUNITY)
Admission: EM | Admit: 2020-08-01 | Discharge: 2020-08-01 | Disposition: A | Discharge status: Home / Self Care | Payer: Medicaid Other | Attending: Family Medicine | Admitting: Family Medicine

## 2020-08-01 ENCOUNTER — Encounter (HOSPITAL_COMMUNITY): Payer: Self-pay

## 2020-08-01 ENCOUNTER — Other Ambulatory Visit: Payer: Self-pay

## 2020-08-01 DIAGNOSIS — Z3202 Encounter for pregnancy test, result negative: Secondary | ICD-10-CM | POA: Diagnosis not present

## 2020-08-01 DIAGNOSIS — Z20822 Contact with and (suspected) exposure to covid-19: Secondary | ICD-10-CM | POA: Insufficient documentation

## 2020-08-01 DIAGNOSIS — R55 Syncope and collapse: Secondary | ICD-10-CM | POA: Diagnosis not present

## 2020-08-01 DIAGNOSIS — R110 Nausea: Secondary | ICD-10-CM | POA: Insufficient documentation

## 2020-08-01 DIAGNOSIS — R519 Headache, unspecified: Secondary | ICD-10-CM | POA: Insufficient documentation

## 2020-08-01 LAB — POCT URINALYSIS DIPSTICK, ED / UC
Bilirubin Urine: NEGATIVE
Glucose, UA: NEGATIVE mg/dL
Leukocytes,Ua: NEGATIVE
Nitrite: NEGATIVE
Protein, ur: NEGATIVE mg/dL
Specific Gravity, Urine: 1.025 (ref 1.005–1.030)
Urobilinogen, UA: 0.2 mg/dL (ref 0.0–1.0)
pH: 6 (ref 5.0–8.0)

## 2020-08-01 LAB — POC URINE PREG, ED: Preg Test, Ur: NEGATIVE

## 2020-08-01 MED ORDER — KETOROLAC TROMETHAMINE 60 MG/2ML IM SOLN
INTRAMUSCULAR | Status: AC
  Filled 2020-08-01: qty 2

## 2020-08-01 MED ORDER — KETOROLAC TROMETHAMINE 60 MG/2ML IM SOLN
60.0000 mg | Freq: Once | INTRAMUSCULAR | Status: AC
  Administered 2020-08-01: 60 mg via INTRAMUSCULAR

## 2020-08-01 NOTE — Discharge Instructions (Addendum)
Naprosyn for headache pain twice daily Reglan three times daily for headache and nausea If symptoms persist and do not improve recommend, follow-up with PCP Emergency department if any episodes of passing out occur

## 2020-08-01 NOTE — ED Notes (Signed)
Patient requesting covid test.

## 2020-08-01 NOTE — ED Provider Notes (Signed)
[facility]    CSN: [id number] Arrival date & time: 08/01/20  1620      History   Chief Complaint Chief Complaint  Patient presents with  . Loss of Consciousness  . Nausea  . Headache    HPI Makayla White is a 24 y.o. female.   HPI  Patient presents today requesting a pregnancy test as she has had recurrent nausea and HA. During triage she reported an episode of syncope, however refused to discuss this symptom and wanted to confirm is she pregnant. Patient's last menstrual period was 07/02/2020.  she is alert and speaking in complete sentences during the encounter.  She further reports recently experiencing recurrent headaches and nausea. No known history of migraines. Concern for possible COVID and would like testing. No shortness of breath, chest pain, or diarrhea.  Past Medical History:  Diagnosis Date  . Anemia   . Anxiety   . Asthma   . Chlamydia   . Gonorrhea   . Scoliosis     Patient Active Problem List   Diagnosis Date Noted  . Anemia 03/11/2020  . Premature rupture of membranes 03/09/2020  . Chlamydia infection affecting pregnancy in first trimester, antepartum 11/23/2019  . Short interval between pregnancies affecting pregnancy, antepartum 11/22/2019  . History of prior pregnancy with SGA newborn 11/22/2019  . History of precipitous delivery 11/22/2019  . Supervision of other normal pregnancy, antepartum 11/21/2019    Past Surgical History:  Procedure Laterality Date  . NOSE SURGERY    . NOSE SURGERY    . TONSILLECTOMY      OB History    Gravida  3   Para  3   Term  2   Preterm  1   AB      Living  3     SAB      TAB      Ectopic      Multiple  0   Live Births  3            Home Medications    Prior to Admission medications   Medication Sig Start Date End Date Taking? Authorizing Provider  acetaminophen (TYLENOL) 325 MG tablet Take 2 tablets (650 mg total) by mouth every 4 (four) hours as needed (for  pain scale < 4). 03/11/20   Aletha Halim, MD  Blood Pressure Monitoring (BLOOD PRESSURE KIT) DEVI 1 kit by Does not apply route once a week. Check Blood Pressure regularly and record readings into the Babyscripts App.  Large Cuff.  DX O90.0 02/20/20   Woodroe Mode, MD  docusate sodium (COLACE) 100 MG capsule Take 1 capsule (100 mg total) by mouth 2 (two) times daily as needed. 03/11/20   Aletha Halim, MD  Elastic Bandages & Supports (COMFORT FIT MATERNITY SUPP SM) MISC Wear as directed. 01/23/20   Shelly Bombard, MD  fluconazole (DIFLUCAN) 150 MG tablet Take 1 tablet (150 mg total) by mouth daily for 2 days. 08/06/20 08/08/20  Chase Picket, MD  ibuprofen (ADVIL) 600 MG tablet Take 1 tablet (600 mg total) by mouth every 6 (six) hours as needed. 03/11/20   Aletha Halim, MD  iron polysaccharides (NIFEREX) 150 MG capsule Take 1 capsule (150 mg total) by mouth daily. 03/11/20 04/25/20  Aletha Halim, MD  Prenatal Vit-Fe Fumarate-FA (PRENATAL MULTIVITAMIN) TABS tablet Take 1 tablet by mouth daily at 12 noon.    [provider]  promethazine (PHENERGAN) 25 MG tablet Take 1 tablet (25 mg total) by  mouth every 6 (six) hours as needed for nausea or vomiting. Patient not taking: Reported on 03/09/2020 10/18/19   Jorje Guild, NP  sertraline (ZOLOFT) 50 MG tablet Take 1 tablet (50 mg total) by mouth daily. 03/27/20   Constant, Peggy, MD    Family History Family History  Problem Relation Age of Onset  . Lupus Mother   . Anxiety disorder Mother   . Arthritis Mother   . Depression Mother   . Kidney disease Mother   . Healthy Father   . Drug abuse Father     Social History Social History   Tobacco Use  . Smoking status: Never Smoker  . Smokeless tobacco: Never Used  Vaping Use  . Vaping Use: Never used  Substance Use Topics  . Alcohol use: No  . Drug use: No     Allergies   Patient has no known allergies.   Review of Systems Review of Systems Pertinent negatives  listed in HPI   Physical Exam Triage Vital Signs ED Triage Vitals  Enc Vitals Group     BP 08/01/20 1831 118/67     Pulse Rate 08/01/20 1831 65     Resp 08/01/20 1831 19     Temp 08/01/20 1831 98.9 F (37.2 C)     Temp src --      SpO2 08/01/20 1831 100 %     Weight --      Height --      Head Circumference --      Peak Flow --      Pain Score 08/01/20 1830 7     Pain Loc --      Pain Edu? --      Excl. in [place]? --    No data found.  Updated Vital Signs BP 118/67   Pulse 65   Temp 98.9 F (37.2 C)   Resp 19   LMP 07/02/2020   SpO2 100%   Breastfeeding No   Visual Acuity Right Eye Distance:   Left Eye Distance:   Bilateral Distance:    Right Eye Near:   Left Eye Near:    Bilateral Near:     Physical Exam General appearance: alert, well developed, well nourished, cooperative and in no distress Head: Normocephalic, without obvious abnormality, atraumatic Eye: PERRLA, sclera white,   Respiratory: Respirations even and unlabored, normal respiratory rate Heart: rate and rhythm normal. No gallop or murmurs noted on exam  Abdomen: BS +, no distention, no rebound tenderness, or no mass Extremities: No gross deformities Skin: Skin color, texture, turgor normal. No rashes seen  Psych: Appropriate mood and affect. Neurologic: GCS 15, symmetrical movements and gait intact.   UC Treatments / Results  Labs (all labs ordered are listed, but only abnormal results are displayed) Labs Reviewed  POCT URINALYSIS DIPSTICK, ED / UC - Abnormal; Notable for the following components:      Result Value   Ketones, ur TRACE (*)    Hgb urine dipstick TRACE (*)    All other components within normal limits  CERVICOVAGINAL ANCILLARY ONLY - Abnormal; Notable for the following components:   Candida Vaginitis Positive (*)    All other components within normal limits  SARS CORONAVIRUS 2 (TAT 6-24 HRS)  POC URINE PREG, ED    EKG   Radiology No results  found.  Procedures Procedures (including critical care time)  Medications Ordered in UC Medications  ketorolac (TORADOL) injection 60 mg (60 mg Intramuscular Given 08/01/20 2015)    Initial  Impression / Assessment and Plan / UC Course  I have reviewed the triage vital signs and the nursing notes.  Pertinent labs & imaging results that were available during my care of the patient were reviewed by me and considered in my medical decision making (see chart for details).    Urine pregnancy, negative.  COVID-19 test pending.  Will trial Reglan 3 times daily as needed for headache and nausea given headache is likely precipitating nausea episodes.  Will also add naproxen twice daily as needed for headache.  If any dizziness or weakness or near syncopal episodes occur go immediately to the ER as the urgent care is not appropriate medication for evaluation.  However patient again refuses to discuss syncope information provided on discharge summary.  Final Clinical Impressions(s) / UC Diagnoses   Final diagnoses:  Persistent headaches  Nausea without vomiting     Discharge Instructions     Naprosyn for headache pain twice daily Reglan three times daily for headache and nausea If symptoms persist and do not improve recommend, follow-up with PCP Emergency department if any episodes of passing out occur     ED Prescriptions    None     PDMP not reviewed this encounter.   Scot Jun, FNP 08/06/20 1906

## 2020-08-01 NOTE — ED Triage Notes (Addendum)
Pt presents with complaints of syncope that happened today. Reports she has had headaches and nausea x 2 days. Concerned for pregnancy. Pt is alert and oriented at that time. Pt states she does not want to talk to a provider about syncope in triage after this RN went to get provider, she only wants to know if she is pregnant.

## 2020-08-02 LAB — SARS CORONAVIRUS 2 (TAT 6-24 HRS): SARS Coronavirus 2: NEGATIVE

## 2020-08-02 LAB — CERVICOVAGINAL ANCILLARY ONLY
Bacterial Vaginitis (gardnerella): NEGATIVE
Candida Glabrata: NEGATIVE
Candida Vaginitis: POSITIVE — AB
Chlamydia: NEGATIVE
Comment: NEGATIVE
Comment: NEGATIVE
Comment: NEGATIVE
Comment: NEGATIVE
Comment: NEGATIVE
Comment: NORMAL
Neisseria Gonorrhea: NEGATIVE
Trichomonas: NEGATIVE

## 2020-08-06 ENCOUNTER — Telehealth (HOSPITAL_COMMUNITY): Payer: Self-pay

## 2020-08-06 MED ORDER — FLUCONAZOLE 150 MG PO TABS: 150.0000 mg | ORAL_TABLET | Freq: Every day | ORAL | 0 refills | Status: AC

## 2020-08-09 ENCOUNTER — Ambulatory Visit (HOSPITAL_COMMUNITY)
Admission: EM | Admit: 2020-08-09 | Discharge: 2020-08-09 | Disposition: A | Discharge status: Home / Self Care | Payer: Medicaid Other | Attending: Emergency Medicine | Admitting: Emergency Medicine

## 2020-08-09 ENCOUNTER — Other Ambulatory Visit: Payer: Self-pay

## 2020-08-09 ENCOUNTER — Encounter (HOSPITAL_COMMUNITY): Payer: Self-pay

## 2020-08-09 DIAGNOSIS — R519 Headache, unspecified: Secondary | ICD-10-CM | POA: Diagnosis not present

## 2020-08-09 MED ORDER — NAPROXEN 500 MG PO TABS: 500.0000 mg | ORAL_TABLET | Freq: Two times a day (BID) | ORAL | 0 refills | Status: DC

## 2020-08-09 MED ORDER — IBUPROFEN 800 MG PO TABS
ORAL_TABLET | ORAL | Status: AC
  Filled 2020-08-09: qty 1

## 2020-08-09 MED ORDER — IBUPROFEN 800 MG PO TABS
800.0000 mg | ORAL_TABLET | Freq: Once | ORAL | Status: AC
  Administered 2020-08-09: 800 mg via ORAL

## 2020-08-09 NOTE — Discharge Instructions (Addendum)
Follow up with primary care for recurrent headaches, if worsening/changing follow up in emergency room Naprosyn twice daily for headache or ibuprofen 800 mg every 8 hours  Stay hydrated Follow up for any concerns

## 2020-08-09 NOTE — ED Triage Notes (Signed)
Pt c/o headache x1 wk. Pt denies N/V, photo sensitivity. Pt states was here recently for same and tested for pregnancy and COVID and both were neg.

## 2020-08-09 NOTE — ED Provider Notes (Signed)
[facility]    CSN: [id number] Arrival date & time: 08/09/20  1948      History   Chief Complaint Chief Complaint  Patient presents with   Headache    HPI Makayla White is a 24 y.o. female presenting today for evaluation of headache.  Patient reports she has had intermittent headaches for approximately 4 days.  Symptoms are off and on, described as pain in bilateral temporal areas.  Initially had nausea, but this is subsided.  Denies associated photophobia.  Using Tylenol with mild relief.  She reports symptoms did start after she had an episode of syncope approximately 4 days ago and was evaluated here.  Provided Toradol which has since subsided her nausea, but continues to have the headaches.  Denies significant history of headaches prior to this, but has been more frequently recently.  She is currently on menstrual cycle.  Had prior Covid test which is negative.  Denies URI symptoms.  HPI  Past Medical History:  Diagnosis Date   Anemia    Anxiety    Asthma    Chlamydia    Gonorrhea    Scoliosis     Patient Active Problem List   Diagnosis Date Noted   Anemia 03/11/2020   Premature rupture of membranes 03/09/2020   Chlamydia infection affecting pregnancy in first trimester, antepartum 11/23/2019   Short interval between pregnancies affecting pregnancy, antepartum 11/22/2019   History of prior pregnancy with SGA newborn 11/22/2019   History of precipitous delivery 11/22/2019   Supervision of other normal pregnancy, antepartum 11/21/2019    Past Surgical History:  Procedure Laterality Date   NOSE SURGERY     NOSE SURGERY     TONSILLECTOMY      OB History    Gravida  3   Para  3   Term  2   Preterm  1   AB      Living  3     SAB      TAB      Ectopic      Multiple  0   Live Births  3            Home Medications    Prior to Admission medications   Medication Sig Start Date End Date Taking? Authorizing  Provider  acetaminophen (TYLENOL) 325 MG tablet Take 2 tablets (650 mg total) by mouth every 4 (four) hours as needed (for pain scale < 4). 03/11/20   Aletha Halim, MD  Blood Pressure Monitoring (BLOOD PRESSURE KIT) DEVI 1 kit by Does not apply route once a week. Check Blood Pressure regularly and record readings into the Babyscripts App.  Large Cuff.  DX O90.0 02/20/20   Woodroe Mode, MD  docusate sodium (COLACE) 100 MG capsule Take 1 capsule (100 mg total) by mouth 2 (two) times daily as needed. 03/11/20   Aletha Halim, MD  Elastic Bandages & Supports (COMFORT FIT MATERNITY SUPP SM) MISC Wear as directed. 01/23/20   Shelly Bombard, MD  ibuprofen (ADVIL) 600 MG tablet Take 1 tablet (600 mg total) by mouth every 6 (six) hours as needed. 03/11/20   Aletha Halim, MD  naproxen (NAPROSYN) 500 MG tablet Take 1 tablet (500 mg total) by mouth 2 (two) times daily. 08/09/20   Haden Cavenaugh C, PA-C  promethazine (PHENERGAN) 25 MG tablet Take 1 tablet (25 mg total) by mouth every 6 (six) hours as needed for nausea or vomiting. Patient not taking: Reported on 03/09/2020 10/18/19   Purcell Nails,  Junie Panning, NP  iron polysaccharides (NIFEREX) 150 MG capsule Take 1 capsule (150 mg total) by mouth daily. 03/11/20 08/09/20  Aletha Halim, MD  sertraline (ZOLOFT) 50 MG tablet Take 1 tablet (50 mg total) by mouth daily. 03/27/20 08/09/20  Constant, Peggy, MD    Family History Family History  Problem Relation Age of Onset   Lupus Mother    Anxiety disorder Mother    Arthritis Mother    Depression Mother    Kidney disease Mother    Healthy Father    Drug abuse Father     Social History Social History   Tobacco Use   Smoking status: Never Smoker   Smokeless tobacco: Never Used  Scientific laboratory technician Use: Never used  Substance Use Topics   Alcohol use: No   Drug use: No     Allergies   Patient has no known allergies.   Review of Systems Review of Systems  Constitutional: Negative for  fatigue and fever.  HENT: Negative for congestion, sinus pressure and sore throat.   Eyes: Negative for photophobia, pain and visual disturbance.  Respiratory: Negative for cough and shortness of breath.   Cardiovascular: Negative for chest pain.  Gastrointestinal: Negative for abdominal pain, nausea and vomiting.  Genitourinary: Negative for decreased urine volume and hematuria.  Musculoskeletal: Negative for myalgias, neck pain and neck stiffness.  Neurological: Positive for headaches. Negative for dizziness, syncope, facial asymmetry, speech difficulty, weakness, light-headedness and numbness.     Physical Exam Triage Vital Signs ED Triage Vitals  Enc Vitals Group     BP 08/09/20 2033 126/69     Pulse Rate 08/09/20 2033 74     Resp 08/09/20 2033 16     Temp 08/09/20 2033 98.7 F (37.1 C)     Temp Source 08/09/20 2033 Oral     SpO2 08/09/20 2033 100 %     Weight 08/09/20 2033 135 lb (61.2 kg)     Height 08/09/20 2033 '5\' 5"'  (1.651 m)     Head Circumference --      Peak Flow --      Pain Score 08/09/20 2032 7     Pain Loc --      Pain Edu? --      Excl. in [place]? --    No data found.  Updated Vital Signs BP 126/69    Pulse 74    Temp 98.7 F (37.1 C) (Oral)    Resp 16    Ht '5\' 5"'  (1.651 m)    Wt 135 lb (61.2 kg)    SpO2 100%    BMI 22.47 kg/m   Visual Acuity Right Eye Distance:   Left Eye Distance:   Bilateral Distance:    Right Eye Near:   Left Eye Near:    Bilateral Near:     Physical Exam Vitals and nursing note reviewed.  Constitutional:      Appearance: She is well-developed.     Comments: No acute distress  HENT:     Head: Normocephalic and atraumatic.     Ears:     Comments: Bilateral ears without tenderness to palpation of external auricle, tragus and mastoid, EAC's without erythema or swelling, TM's with good bony landmarks and cone of light. Non erythematous.     Nose: Nose normal.  Eyes:     Extraocular Movements: Extraocular movements intact.      Conjunctiva/sclera: Conjunctivae normal.     Pupils: Pupils are equal, round, and reactive to light.  Comments: Wearing glasses  Cardiovascular:     Rate and Rhythm: Normal rate.  Pulmonary:     Effort: Pulmonary effort is normal. No respiratory distress.     Comments: Breathing comfortably at rest, CTABL, no wheezing, rales or other adventitious sounds auscultated Abdominal:     General: There is no distension.  Musculoskeletal:        General: Normal range of motion.     Cervical back: Neck supple.  Skin:    General: Skin is warm and dry.  Neurological:     General: No focal deficit present.     Mental Status: She is alert and oriented to person, place, and time. Mental status is at baseline.     Comments: Patient A&O x3, cranial nerves II-XII grossly intact, strength intact, moving all extremities appropriately, Gait without abnormality.      UC Treatments / Results  Labs (all labs ordered are listed, but only abnormal results are displayed) Labs Reviewed - No data to display  EKG   Radiology No results found.  Procedures Procedures (including critical care time)  Medications Ordered in UC Medications  ibuprofen (ADVIL) tablet 800 mg (800 mg Oral Given 08/09/20 2156)    Initial Impression / Assessment and Plan / UC Course  I have reviewed the triage vital signs and the nursing notes.  Pertinent labs & imaging results that were available during my care of the patient were reviewed by me and considered in my medical decision making (see chart for details).     Intermittent headaches, no neuro deficits or red flags on exam.  Is new pattern, recommend following up with PCP for further evaluation of headaches outpatient.  Patient to follow-up in emergency room if having any worsening symptoms.  Did offer migraine cocktail, patient wishes declined any injections, will proceed with ibuprofen prior to discharge and will send home with Naprosyn to use as needed for  headaches.  Discussed strict return precautions. Patient verbalized understanding and is agreeable with plan.  Final Clinical Impressions(s) / UC Diagnoses   Final diagnoses:  Acute nonintractable headache, unspecified headache type     Discharge Instructions     Follow up with primary care for recurrent headaches, if worsening/changing follow up in emergency room Naprosyn twice daily for headache or ibuprofen 800 mg every 8 hours  Stay hydrated Follow up for any concerns    ED Prescriptions    Medication Sig Dispense Auth. Provider   naproxen (NAPROSYN) 500 MG tablet Take 1 tablet (500 mg total) by mouth 2 (two) times daily. 30 tablet Kumiko Fishman, [place] C, PA-C     PDMP not reviewed this encounter.   Janith Lima, [place] 08/10/20 [phone]

## 2020-10-24 ENCOUNTER — Inpatient Hospital Stay (HOSPITAL_COMMUNITY)
Admission: AD | Admit: 2020-10-24 | Discharge: 2020-10-24 | Disposition: A | Discharge status: Home / Self Care | Payer: Medicaid Other | Attending: Family Medicine | Admitting: Family Medicine

## 2020-10-24 ENCOUNTER — Other Ambulatory Visit: Payer: Self-pay

## 2020-10-24 ENCOUNTER — Inpatient Hospital Stay (HOSPITAL_COMMUNITY): Payer: Medicaid Other

## 2020-10-24 ENCOUNTER — Encounter (HOSPITAL_COMMUNITY): Payer: Self-pay | Admitting: Emergency Medicine

## 2020-10-24 DIAGNOSIS — R519 Headache, unspecified: Secondary | ICD-10-CM | POA: Insufficient documentation

## 2020-10-24 DIAGNOSIS — R55 Syncope and collapse: Secondary | ICD-10-CM | POA: Diagnosis not present

## 2020-10-24 DIAGNOSIS — R824 Acetonuria: Secondary | ICD-10-CM

## 2020-10-24 DIAGNOSIS — R9431 Abnormal electrocardiogram [ECG] [EKG]: Secondary | ICD-10-CM | POA: Diagnosis not present

## 2020-10-24 DIAGNOSIS — O219 Vomiting of pregnancy, unspecified: Secondary | ICD-10-CM | POA: Diagnosis present

## 2020-10-24 DIAGNOSIS — O26891 Other specified pregnancy related conditions, first trimester: Secondary | ICD-10-CM | POA: Insufficient documentation

## 2020-10-24 DIAGNOSIS — Z3490 Encounter for supervision of normal pregnancy, unspecified, unspecified trimester: Secondary | ICD-10-CM

## 2020-10-24 DIAGNOSIS — O99511 Diseases of the respiratory system complicating pregnancy, first trimester: Secondary | ICD-10-CM | POA: Diagnosis not present

## 2020-10-24 DIAGNOSIS — J45909 Unspecified asthma, uncomplicated: Secondary | ICD-10-CM | POA: Insufficient documentation

## 2020-10-24 DIAGNOSIS — Z3A01 Less than 8 weeks gestation of pregnancy: Secondary | ICD-10-CM | POA: Insufficient documentation

## 2020-10-24 DIAGNOSIS — Z79899 Other long term (current) drug therapy: Secondary | ICD-10-CM | POA: Insufficient documentation

## 2020-10-24 DIAGNOSIS — O99891 Other specified diseases and conditions complicating pregnancy: Secondary | ICD-10-CM | POA: Diagnosis not present

## 2020-10-24 DIAGNOSIS — O211 Hyperemesis gravidarum with metabolic disturbance: Secondary | ICD-10-CM

## 2020-10-24 LAB — URINALYSIS, ROUTINE W REFLEX MICROSCOPIC
Bilirubin Urine: NEGATIVE
Glucose, UA: NEGATIVE mg/dL
Hgb urine dipstick: NEGATIVE
Ketones, ur: 80 mg/dL — AB
Leukocytes,Ua: NEGATIVE
Nitrite: NEGATIVE
Protein, ur: NEGATIVE mg/dL
Specific Gravity, Urine: 1.027 (ref 1.005–1.030)
pH: 5 (ref 5.0–8.0)

## 2020-10-24 LAB — COMPREHENSIVE METABOLIC PANEL
ALT: 17 U/L (ref 0–44)
AST: 38 U/L (ref 15–41)
Albumin: 4.1 g/dL (ref 3.5–5.0)
Alkaline Phosphatase: 83 U/L (ref 38–126)
Anion gap: 11 (ref 5–15)
BUN: 13 mg/dL (ref 6–20)
CO2: 21 mmol/L — ABNORMAL LOW (ref 22–32)
Calcium: 9.6 mg/dL (ref 8.9–10.3)
Chloride: 109 mmol/L (ref 98–111)
Creatinine, Ser: 0.74 mg/dL (ref 0.44–1.00)
GFR, Estimated: >60 mL/min (ref >60)
Glucose, Bld: 81 mg/dL (ref 70–99)
Potassium: 5.3 mmol/L — ABNORMAL HIGH (ref 3.5–5.1)
Sodium: 141 mmol/L (ref 135–145)
Total Bilirubin: 0.7 mg/dL (ref 0.3–1.2)
Total Protein: 7.8 g/dL (ref 6.5–8.1)

## 2020-10-24 LAB — CBC
HCT: 34.7 % — ABNORMAL LOW (ref 36.0–46.0)
Hemoglobin: 11.2 g/dL — ABNORMAL LOW (ref 12.0–15.0)
MCH: 27.1 pg (ref 26.0–34.0)
MCHC: 32.3 g/dL (ref 30.0–36.0)
MCV: 83.8 fL (ref 80.0–100.0)
Platelets: 385 10*3/uL (ref 150–400)
RBC: 4.14 MIL/uL (ref 3.87–5.11)
RDW: 15.1 % (ref 11.5–15.5)
WBC: 10.3 10*3/uL (ref 4.0–10.5)
nRBC: 0 % (ref 0.0–0.2)

## 2020-10-24 LAB — POC URINE PREG, ED: Preg Test, Ur: POSITIVE — AB

## 2020-10-24 LAB — HCG, QUANTITATIVE, PREGNANCY: hCG, Beta Chain, Quant, S: 86782 m[IU]/mL — ABNORMAL HIGH (ref <5)

## 2020-10-24 MED ORDER — ACETAMINOPHEN 160 MG/5ML PO SOLN
650.0000 mg | Freq: Once | ORAL | Status: AC
  Administered 2020-10-24: 650 mg via ORAL
  Filled 2020-10-24: qty 20.3

## 2020-10-24 MED ORDER — DOXYLAMINE SUCCINATE (SLEEP) 25 MG PO TABS: 25.0000 mg | ORAL_TABLET | Freq: Three times a day (TID) | ORAL | 0 refills | Status: AC | PRN

## 2020-10-24 MED ORDER — PYRIDOXINE HCL 25 MG PO TABS: 25.0000 mg | ORAL_TABLET | Freq: Three times a day (TID) | ORAL | 0 refills | Status: AC

## 2020-10-24 MED ORDER — HYDROXYZINE HCL 50 MG/ML IM SOLN
25.0000 mg | Freq: Once | INTRAMUSCULAR | Status: AC
  Administered 2020-10-24: 25 mg via INTRAMUSCULAR
  Filled 2020-10-24: qty 0.5

## 2020-10-24 MED ORDER — PROMETHAZINE HCL 12.5 MG PO TABS: 12.5000 mg | ORAL_TABLET | Freq: Four times a day (QID) | ORAL | 0 refills | Status: AC | PRN

## 2020-10-24 MED ORDER — ACETAMINOPHEN 160 MG/5ML PO ELIX: 500.0000 mg | ORAL_SOLUTION | ORAL | 0 refills | Status: AC | PRN

## 2020-10-24 NOTE — ED Triage Notes (Signed)
Emergency Medicine Provider OB Triage Evaluation Note  Makayla White is a 24 y.o. female, [id number], at Unknown gestation who presents to the emergency department with complaints of n/v and weakness. sxs present x1 wk. Recent birth in 02/2020 at 26 wks with PPROM  Review of  Systems  Positive: n/v, weakness/lightheadedness Negative: abd pain, fever, vaginal bleeding  Physical Exam  BP 112/87   Pulse 90   Temp 99.1 F (37.3 C) (Oral)   Resp 16   Ht 5\' 5"  (1.651 m)   SpO2 100%   BMI 22.47 kg/m  General: Awake, no distress  HEENT: Atraumatic  Resp: Normal effort  Cardiac: Normal rate Abd: Nondistended, nontender  MSK: Moves all extremities without difficulty Neuro: Speech clear  Medical Decision Making  Pt evaluated for pregnancy concern and is stable for transfer to MAU. Pt is in agreement with plan for transfer.  3:01 PM Discussed with MAU APP, , who accepts patient in transfer.  Clinical Impression  No diagnosis found.     Joni Reining, PA-C 10/24/20 1502

## 2020-10-24 NOTE — ED Notes (Signed)
PA [place] in to screen patient for MAU, advised pt can be transferred to MAU at this time.

## 2020-10-24 NOTE — ED Notes (Signed)
Transport called to take patient over to MAU 

## 2020-10-24 NOTE — MAU Note (Signed)
Presents with c/o N/V, light headedness, and H/A.  Reports unable to keep food down, not liquids.  States hasn't taken any meds to relieve H/A.  States feeling light headed, "passed out" on Monday. LMP 09/01/2020.  Denies VB.

## 2020-10-24 NOTE — Progress Notes (Signed)
Cab voucher requested by staff.  Agreed to provide this as a 1-time only event.  Asked staff to explain the same.  Other transportation options will need to be explored by the patient and at their expense from this point forward.

## 2020-10-24 NOTE — MAU Note (Signed)
Pages x 2 to [phone] for vascular tech, multiple calls to vascular lab.

## 2020-10-24 NOTE — MAU Provider Note (Signed)
History     CSN: [id number]  Arrival date and time: 10/24/20 1417   First Provider Initiated Contact with Patient 10/24/20 1611      Chief Complaint  Patient presents with  . Emesis During Pregnancy  . Headache   HPI Makayla White is a 24 y.o. [phone] at 5w4dwho presents to MAU with chief complaints of nausea and vomiting. Patient states she can tolerate solid food but has been unable to tolerate any liquid for about one week. She believes she has lost 15 pounds. She has attempted water, milk, soda, and fruit juices but consistently vomits afterwards. She is not aware of OTC treatments for vomiting and does not have any prescriptions to assist with her tolerance.  Patient reports one episode of syncope Monday 10/21/2020. She states she was standing in her bedroom, drinking a soda. She sensed that she was about to faint and fell backwards across the bed. She states her boyfriend was home and said she was unconscious on her bed for a period of about one minute. She denies chest pain, palpitations, weakness. She denies history of syncopal episodes at any time prior to Monday.   She denies abdominal pain, vaginal bleeding, dysuria, constipation.  Patient states she is phobic of needles and cannot tolerate an IV.  OB History    Gravida  4   Para  3   Term  2   Preterm  1   AB      Living  3     SAB      TAB      Ectopic      Multiple  0   Live Births  3           Past Medical History:  Diagnosis Date  . Anemia   . Anxiety   . Asthma   . Chlamydia   . Gonorrhea   . Scoliosis     Past Surgical History:  Procedure Laterality Date  . NOSE SURGERY    . NOSE SURGERY    . TONSILLECTOMY      Family History  Problem Relation Age of Onset  . Lupus Mother   . Anxiety disorder Mother   . Arthritis Mother   . Depression Mother   . Kidney disease Mother   . Healthy Father   . Drug abuse Father     Social History   Tobacco Use  . Smoking  status: Never Smoker  . Smokeless tobacco: Never Used  Vaping Use  . Vaping Use: Never used  Substance Use Topics  . Alcohol use: No  . Drug use: No    Allergies: No Known Allergies  Medications Prior to Admission  Medication Sig Dispense Refill Last Dose  . acetaminophen (TYLENOL) 325 MG tablet Take 2 tablets (650 mg total) by mouth every 4 (four) hours as needed (for pain scale < 4).     . Blood Pressure Monitoring (BLOOD PRESSURE KIT) DEVI 1 kit by Does not apply route once a week. Check Blood Pressure regularly and record readings into the Bab[employer] App.  Large Cuff.  DX O90.0 1 each 0   . docusate sodium (COLACE) 100 MG capsule Take 1 capsule (100 mg total) by mouth 2 (two) times daily as needed. 45 capsule 1   . Elastic Bandages & Supports (COMFORT FIT MATERNITY SUPP SM) MISC Wear as directed. 1 each 0   . ibuprofen (ADVIL) 600 MG tablet Take 1 tablet (600 mg total) by mouth every 6 (six) hours  as needed. 30 tablet 0   . naproxen (NAPROSYN) 500 MG tablet Take 1 tablet (500 mg total) by mouth 2 (two) times daily. 30 tablet 0   . promethazine (PHENERGAN) 25 MG tablet Take 1 tablet (25 mg total) by mouth every 6 (six) hours as needed for nausea or vomiting. (Patient not taking: Reported on 03/09/2020) 30 tablet 0     Review of Systems  Gastrointestinal: Positive for nausea and vomiting.  Neurological: Positive for syncope and weakness.  All other systems reviewed and are negative.  Physical Exam   Blood pressure 117/73, pulse 77, temperature 98.7 F (37.1 C), temperature source Oral, resp. rate 20, height '5\' 5"'  (1.651 m), weight 60.5 kg, last menstrual period 09/01/2020, SpO2 99 %, not currently breastfeeding.  Physical Exam Vitals and nursing note reviewed.  Cardiovascular:     Rate and Rhythm: Normal rate.     Heart sounds: Normal heart sounds.  Pulmonary:     Effort: Pulmonary effort is normal.     Breath sounds: Normal breath sounds.  Abdominal:     General: Bowel  sounds are normal.     Palpations: Abdomen is soft.  Skin:    General: Skin is warm and dry.     Capillary Refill: Capillary refill takes less than 2 seconds.  Neurological:     Mental Status: She is alert and oriented to person, place, and time.  Psychiatric:        Mood and Affect: Mood normal.        Speech: Speech normal.        Behavior: Behavior normal.     MAU Course  Procedures   --Pt's weight recorded as 61.2kg at end of September, 60.5 kg today in MAU --Patient declining lab work when phlebotomy presented to bedside. CNM present, context for labs restated. Pt agreeable. --Patient declines iv access for dehydration. CNM expressed concern for dehydration, ketonuria,  --Abnormal EKG noted by Dr. Dione Plover. Cardiology paged, EKG reviewed, ECHO advised along with outpatient surveillance --Patient unable to receive ECHO after hours --Orders placed for outpatient referral  Orders Placed This Encounter  Procedures  . US OB LESS THAN 14 WEEKS WITH OB TRANSVAGINAL  . Urinalysis, Routine w reflex microscopic Urine, Clean Catch  . CBC  . Comprehensive metabolic panel  . hCG, quantitative, pregnancy  . Ambulatory referral to Cardiology  . POC Urine Pregnancy, ED (not at [facility])  . ED EKG  . ECHOCARDIOGRAM COMPLETE  . Saline lock IV   Results for orders placed or performed during the hospital encounter of 10/24/20 (from the past 24 hour(s))  POC Urine Pregnancy, ED (not at [facility])     Status: Abnormal   Collection Time: 10/24/20  2:51 PM  Result Value Ref Range   Preg Test, Ur POSITIVE (A) NEGATIVE  CBC     Status: Abnormal   Collection Time: 10/24/20  4:32 PM  Result Value Ref Range   WBC 10.3 4.0 - 10.5 K/uL   RBC 4.14 3.87 - 5.11 MIL/uL   Hemoglobin 11.2 (L) 12.0 - 15.0 g/dL   HCT 34.7 (L) 36 - 46 %   MCV 83.8 80.0 - 100.0 fL   MCH 27.1 26.0 - 34.0 pg   MCHC 32.3 30.0 - 36.0 g/dL   RDW 15.1 11.5 - 15.5 %   Platelets 385 150 - 400 K/uL   nRBC 0.0 0.0 - 0.2 %   Comprehensive metabolic panel     Status: Abnormal   Collection Time: 10/24/20  4:32 PM  Result Value Ref Range   Sodium 141 135 - 145 mmol/L   Potassium 5.3 (H) 3.5 - 5.1 mmol/L   Chloride 109 98 - 111 mmol/L   CO2 21 (L) 22 - 32 mmol/L   Glucose, Bld 81 70 - 99 mg/dL   BUN 13 6 - 20 mg/dL   Creatinine, Ser 0.74 0.44 - 1.00 mg/dL   Calcium 9.6 8.9 - 10.3 mg/dL   Total Protein 7.8 6.5 - 8.1 g/dL   Albumin 4.1 3.5 - 5.0 g/dL   AST 38 15 - 41 U/L   ALT 17 0 - 44 U/L   Alkaline Phosphatase 83 38 - 126 U/L   Total Bilirubin 0.7 0.3 - 1.2 mg/dL   GFR, Estimated >60 >60 mL/min   Anion gap 11 5 - 15  hCG, quantitative, pregnancy     Status: Abnormal   Collection Time: 10/24/20  4:32 PM  Result Value Ref Range   hCG, Beta Chain, Quant, S 86,782 (H) <5 mIU/mL  Urinalysis, Routine w reflex microscopic Urine, Clean Catch     Status: Abnormal   Collection Time: 10/24/20  6:08 PM  Result Value Ref Range   Color, Urine YELLOW YELLOW   APPearance HAZY (A) CLEAR   Specific Gravity, Urine 1.027 1.005 - 1.030   pH 5.0 5.0 - 8.0   Glucose, UA NEGATIVE NEGATIVE mg/dL   Hgb urine dipstick NEGATIVE NEGATIVE   Bilirubin Urine NEGATIVE NEGATIVE   Ketones, ur 80 (A) NEGATIVE mg/dL   Protein, ur NEGATIVE NEGATIVE mg/dL   Nitrite NEGATIVE NEGATIVE   Leukocytes,Ua NEGATIVE NEGATIVE   US OB LESS THAN 14 WEEKS WITH OB TRANSVAGINAL  Result Date: 10/24/2020 CLINICAL DATA:  Syncope, pregnant, LMP 09/01/2020 EXAM: OBSTETRIC <14 WK [place] AND TRANSVAGINAL OB US TECHNIQUE: Both transabdominal and transvaginal ultrasound examinations were performed for complete evaluation of the gestation as well as the maternal uterus, adnexal regions, and pelvic cul-de-sac. Transvaginal technique was performed to assess early pregnancy. COMPARISON:  None FINDINGS: Intrauterine gestational sac: Present, single Yolk sac:  Present Embryo:  Present Cardiac Activity: Present Heart Rate: 154 bpm CRL:  10.5 mm   7 w   1 d                   [place] EDC: 06/11/2021 Subchorionic hemorrhage:  None visualized. Maternal uterus/adnexae: Uterus retroverted, otherwise unremarkable. LEFT ovary normal size and morphology. Complicated corpus luteal cyst within RIGHT ovary. Trace free pelvic fluid. No additional adnexal masses. IMPRESSION: Single live intrauterine gestation at 7 weeks 1 day EGA by crown-rump length. Electronically Signed   By: Lavonia Dana M.D.   On: 10/24/2020 17:04   Assessment and Plan  --25 y.o. [phone] with confirmed IUP at [redacted]w[redacted]d --Syncopal episode x 1  --Abnormal EKG --Ketonuria --New outpatient regimen for nausea/vomiting --Patient tolerating PO solids and liquids throughout eval in MAU --IV fluids declined by patient --Discharge home in stable condition, report to closest ED for recurrence of syncope  F/U: --Outpatient Cardiology referral  Patient given taxi voucher by [place] CNM 10/24/2020, 8:22 PM

## 2020-10-24 NOTE — ED Triage Notes (Signed)
Pt reports vomiting without abd pain or diarrhea for the past few weeks, states she took a home preg test that was positive. LMP 10/10

## 2020-10-25 ENCOUNTER — Encounter (HOSPITAL_COMMUNITY): Payer: Self-pay

## 2020-10-25 ENCOUNTER — Emergency Department (HOSPITAL_COMMUNITY)
Admission: EM | Admit: 2020-10-25 | Discharge: 2020-10-26 | Disposition: A | Discharge status: Home / Self Care | Payer: Medicaid Other | Attending: Emergency Medicine | Admitting: Emergency Medicine

## 2020-10-25 ENCOUNTER — Emergency Department (HOSPITAL_COMMUNITY): Payer: Medicaid Other

## 2020-10-25 ENCOUNTER — Other Ambulatory Visit: Payer: Self-pay

## 2020-10-25 DIAGNOSIS — R519 Headache, unspecified: Secondary | ICD-10-CM | POA: Diagnosis not present

## 2020-10-25 DIAGNOSIS — R079 Chest pain, unspecified: Secondary | ICD-10-CM | POA: Insufficient documentation

## 2020-10-25 DIAGNOSIS — J45909 Unspecified asthma, uncomplicated: Secondary | ICD-10-CM | POA: Insufficient documentation

## 2020-10-25 DIAGNOSIS — O99411 Diseases of the circulatory system complicating pregnancy, first trimester: Secondary | ICD-10-CM | POA: Insufficient documentation

## 2020-10-25 DIAGNOSIS — Z3A01 Less than 8 weeks gestation of pregnancy: Secondary | ICD-10-CM | POA: Insufficient documentation

## 2020-10-25 DIAGNOSIS — O26891 Other specified pregnancy related conditions, first trimester: Secondary | ICD-10-CM | POA: Diagnosis not present

## 2020-10-25 DIAGNOSIS — R55 Syncope and collapse: Secondary | ICD-10-CM | POA: Diagnosis not present

## 2020-10-25 LAB — TROPONIN I (HIGH SENSITIVITY): Troponin I (High Sensitivity): <2 ng/L (ref <18)

## 2020-10-25 LAB — BASIC METABOLIC PANEL
Anion gap: 10 (ref 5–15)
BUN: 12 mg/dL (ref 6–20)
CO2: 23 mmol/L (ref 22–32)
Calcium: 9.3 mg/dL (ref 8.9–10.3)
Chloride: 105 mmol/L (ref 98–111)
Creatinine, Ser: 0.62 mg/dL (ref 0.44–1.00)
GFR, Estimated: >60 mL/min (ref >60)
Glucose, Bld: 81 mg/dL (ref 70–99)
Potassium: 3.2 mmol/L — ABNORMAL LOW (ref 3.5–5.1)
Sodium: 138 mmol/L (ref 135–145)

## 2020-10-25 LAB — CBC
HCT: 34.2 % — ABNORMAL LOW (ref 36.0–46.0)
Hemoglobin: 10.7 g/dL — ABNORMAL LOW (ref 12.0–15.0)
MCH: 26.6 pg (ref 26.0–34.0)
MCHC: 31.3 g/dL (ref 30.0–36.0)
MCV: 85.1 fL (ref 80.0–100.0)
Platelets: 388 10*3/uL (ref 150–400)
RBC: 4.02 MIL/uL (ref 3.87–5.11)
RDW: 15.2 % (ref 11.5–15.5)
WBC: 9.4 10*3/uL (ref 4.0–10.5)
nRBC: 0 % (ref 0.0–0.2)

## 2020-10-25 LAB — I-STAT BETA HCG BLOOD, ED (MC, WL, AP ONLY): I-stat hCG, quantitative: >2000 m[IU]/mL — ABNORMAL HIGH (ref <5)

## 2020-10-25 NOTE — ED Triage Notes (Signed)
Pt complains of chest pain 7/10 that started at 11am and reported that around 1900 she felt SOB. Reports that she was here yesterday for N/V and was supposed to set up a cardio appt but chest pain kept getting worse - has not taken anything for pain.  Pt reports that she is [redacted] weeks pregnant.

## 2020-10-25 NOTE — ED Notes (Signed)
Pt to xray from triage.

## 2020-10-26 MED ORDER — POTASSIUM CHLORIDE CRYS ER 20 MEQ PO TBCR
20.0000 meq | EXTENDED_RELEASE_TABLET | Freq: Once | ORAL | Status: AC
  Administered 2020-10-26: 20 meq via ORAL
  Filled 2020-10-26: qty 1

## 2020-10-26 NOTE — ED Provider Notes (Signed)
[place] EMERGENCY DEPARTMENT Provider Note   CSN: [id number] Arrival date & time: 10/25/20  1859     History Chief Complaint  Patient presents with  . Chest Pain    Makayla White is a 24 y.o. female G4P2 approximately [redacted] weeks gestation who presents the ED with 7-10 chest pain with associated shortness of breath.  I reviewed patient's medical record and she was evaluated at MAU after sustaining a syncopal episode 10/21/2020.  She was discharged home with outpatient referral to cardiology given abnormal EKG in the context of syncopal episode.  This was on 10/24/2020.  She returned to the ED the following day and prior to my examination has been in the waiting room for 13 hours.  On my examination, patient reports that she continue to experience chest discomfort intermittently since being released from MAU on 10/24/2020.  She then developed worsening chest discomfort yesterday at 11 AM and around 7 PM it had become constant with associated shortness of breath symptoms.  She denies any cough, fevers or chills, abdominal pain, nausea or vomiting, vaginal bleeding, unilateral extremity swelling or edema, history of clots or clotting disorder, pleuritic symptoms, numbness or weakness, or other symptoms.  On my exam, she is resting comfortably and in no acute distress.  She is frustrated given the length of her stay that she had to wait in the waiting area.  She has been here for a total of 14-1/2 hours.  She states that she has not yet heard from cardiology about outpatient appointment and she has not yet established with an OB/GYN.  She states that she is a single mom with three children, she does receive some support from her mother.  HPI     Past Medical History:  Diagnosis Date  . Anemia   . Anxiety   . Asthma   . Chlamydia   . Gonorrhea   . Scoliosis     Patient Active Problem List   Diagnosis Date Noted  . Anemia 03/11/2020  . Premature rupture of  membranes 03/09/2020  . Chlamydia infection affecting pregnancy in first trimester, antepartum 11/23/2019  . Short interval between pregnancies affecting pregnancy, antepartum 11/22/2019  . History of prior pregnancy with SGA newborn 11/22/2019  . History of precipitous delivery 11/22/2019  . Supervision of other normal pregnancy, antepartum 11/21/2019    Past Surgical History:  Procedure Laterality Date  . NOSE SURGERY    . NOSE SURGERY    . TONSILLECTOMY       OB History    Gravida  4   Para  3   Term  2   Preterm  1   AB      Living  3     SAB      TAB      Ectopic      Multiple  0   Live Births  3           Family History  Problem Relation Age of Onset  . Lupus Mother   . Anxiety disorder Mother   . Arthritis Mother   . Depression Mother   . Kidney disease Mother   . Healthy Father   . Drug abuse Father     Social History   Tobacco Use  . Smoking status: Never Smoker  . Smokeless tobacco: Never Used  Vaping Use  . Vaping Use: Never used  Substance Use Topics  . Alcohol use: No  . Drug use: No    Home  Medications Prior to Admission medications   Medication Sig Start Date End Date Taking? Authorizing Provider  Blood Pressure Monitoring (BLOOD PRESSURE KIT) DEVI 1 kit by Does not apply route once a week. Check Blood Pressure regularly and record readings into the [employer] App.  Large Cuff.  DX O90.0 02/20/20  Yes Woodroe Mode, MD  Elastic Bandages & Supports (COMFORT FIT MATERNITY SUPP SM) MISC Wear as directed. 01/23/20  Yes Shelly Bombard, MD  acetaminophen (TYLENOL) 160 MG/5ML elixir Take 15.6 mLs (500 mg total) by mouth every 4 (four) hours as needed for fever or pain. 10/24/20 11/23/20  Darlina Rumpf, CNM  doxylamine, Sleep, (UNISOM) 25 MG tablet Take 1 tablet (25 mg total) by mouth every 8 (eight) hours as needed. 10/24/20   Darlina Rumpf, CNM  promethazine (PHENERGAN) 12.5 MG tablet Take 1 tablet (12.5 mg total) by  mouth every 6 (six) hours as needed for nausea or vomiting. 10/24/20   Darlina Rumpf, CNM  pyridOXINE (VITAMIN B-6) 25 MG tablet Take 1 tablet (25 mg total) by mouth every 8 (eight) hours. 10/24/20   Darlina Rumpf, CNM  iron polysaccharides (NIFEREX) 150 MG capsule Take 1 capsule (150 mg total) by mouth daily. 03/11/20 08/09/20  Aletha Halim, MD  sertraline (ZOLOFT) 50 MG tablet Take 1 tablet (50 mg total) by mouth daily. 03/27/20 08/09/20  Constant, Peggy, MD    Allergies    Patient has no known allergies.  Review of Systems   Review of Systems  All other systems reviewed and are negative.   Physical Exam Updated Vital Signs BP 103/81 (BP Location: Right Arm)   Pulse 74   Temp 97.8 F (36.6 C) (Oral)   Resp 16   LMP 09/01/2020   SpO2 100%   Physical Exam Vitals and nursing note reviewed. Exam conducted with a chaperone present.  Constitutional:      Appearance: Normal appearance.  HENT:     Head: Normocephalic and atraumatic.  Eyes:     General: No scleral icterus.    Conjunctiva/sclera: Conjunctivae normal.  Cardiovascular:     Rate and Rhythm: Normal rate and regular rhythm.     Pulses: Normal pulses.     Heart sounds: Normal heart sounds.     Comments: No murmurs.  HR 70s during my exam.  Regular. Pulmonary:     Effort: Pulmonary effort is normal. No respiratory distress.     Breath sounds: Normal breath sounds. No wheezing or rales.     Comments: CTA bilaterally. Abdominal:     General: Abdomen is flat. There is no distension.     Palpations: Abdomen is soft.     Tenderness: There is no abdominal tenderness. There is no guarding.  Musculoskeletal:     Cervical back: Normal range of motion. No rigidity.     Right lower leg: No edema.     Left lower leg: No edema.     Comments: No obvious unilateral extremity swelling or edema.  No associated tenderness.  Skin:    General: Skin is dry.     Capillary Refill: Capillary refill takes less than 2 seconds.   Neurological:     General: No focal deficit present.     Mental Status: She is alert and oriented to person, place, and time.     GCS: GCS eye subscore is 4. GCS verbal subscore is 5. GCS motor subscore is 6.     Cranial Nerves: No cranial nerve deficit.  Gait: Gait normal.  Psychiatric:        Mood and Affect: Mood normal.        Behavior: Behavior normal.        Thought Content: Thought content normal.     ED Results / Procedures / Treatments   Labs (all labs ordered are listed, but only abnormal results are displayed) Labs Reviewed  BASIC METABOLIC PANEL - Abnormal; Notable for the following components:      Result Value   Potassium 3.2 (*)    All other components within normal limits  CBC - Abnormal; Notable for the following components:   Hemoglobin 10.7 (*)    HCT 34.2 (*)    All other components within normal limits  I-STAT BETA HCG BLOOD, ED ([facility], [facility], [facility] ONLY) - Abnormal; Notable for the following components:   I-stat hCG, quantitative >2,000.0 (*)    All other components within normal limits  TROPONIN I (HIGH SENSITIVITY)    EKG EKG Interpretation  Date/Time:  Friday October 25 2020 19:27:22 EST Ventricular Rate:  80 PR Interval:  176 QRS Duration: 86 QT Interval:  366 QTC Calculation: 422 R Axis:   -16 Text Interpretation: Normal sinus rhythm Nonspecific T wave abnormality Abnormal ECG When compared with ECG of 10/24/2020, No significant change was found Confirmed by Delora Fuel ([id number]) on 10/26/2020 3:17:16 AM   Radiology DG Chest 2 View  Result Date: 10/25/2020 CLINICAL DATA:  Chest pain EXAM: CHEST - 2 VIEW COMPARISON:  None. FINDINGS: No consolidation, features of edema, pneumothorax, or effusion. Pulmonary vascularity is normally distributed. The cardiomediastinal contours are unremarkable. No acute osseous or soft tissue abnormality. Dextrocurvature of the thoracic spine, apex T8 with some likely compensatory curvature of the lumbar levels as well.  IMPRESSION: No acute cardiopulmonary abnormality. Scoliotic curvature of the spine. Electronically Signed   By: Lovena Le M.D.   On: 10/25/2020 20:52   US OB LESS THAN 14 WEEKS WITH OB TRANSVAGINAL  Result Date: 10/24/2020 CLINICAL DATA:  Syncope, pregnant, LMP 09/01/2020 EXAM: OBSTETRIC <14 WK [place] AND TRANSVAGINAL OB US TECHNIQUE: Both transabdominal and transvaginal ultrasound examinations were performed for complete evaluation of the gestation as well as the maternal uterus, adnexal regions, and pelvic cul-de-sac. Transvaginal technique was performed to assess early pregnancy. COMPARISON:  None FINDINGS: Intrauterine gestational sac: Present, single Yolk sac:  Present Embryo:  Present Cardiac Activity: Present Heart Rate: 154 bpm CRL:  10.5 mm   7 w   1 d                  [place] EDC: 06/11/2021 Subchorionic hemorrhage:  None visualized. Maternal uterus/adnexae: Uterus retroverted, otherwise unremarkable. LEFT ovary normal size and morphology. Complicated corpus luteal cyst within RIGHT ovary. Trace free pelvic fluid. No additional adnexal masses. IMPRESSION: Single live intrauterine gestation at 7 weeks 1 day EGA by crown-rump length. Electronically Signed   By: Lavonia Dana M.D.   On: 10/24/2020 17:04    Procedures Procedures (including critical care time)  Medications Ordered in ED Medications  potassium chloride SA (KLOR-CON) CR tablet 20 mEq (has no administration in time range)    ED Course  I have reviewed the triage vital signs and the nursing notes.  Pertinent labs & imaging results that were available during my care of the patient were reviewed by me and considered in my medical decision making (see chart for details).    MDM Rules/Calculators/A&P  Patient presents to the ED with complaints of nonspecific chest discomfort in context of pregnancy.  She had been evaluated a couple of days ago after sustaining a syncopal episode at MAU and discharged home with  outpatient cardiology follow-up.  She states that she had not heard from cardiology yet and when she developed her chest discomfort yesterday, she felt compelled to come to the ED for evaluation.  She also was complaining of mild associated shortness of breath.  She was in the waiting room for extended period of time and by the time I saw her she was resting comfortably and in no acute distress.  Her vital signs are stable and within normal limits.  She is not exhibiting any increased work of breathing or difficulty speaking in full sentences.  Her chest discomfort has improved and is now "comes and goes".  While she is pregnant, she denies any history of clots or clotting disorder.  Her physical exam is entirely reassuring and without any extremity swelling or edema.  Her lungs are CTA bilaterally and no abnormal heart sounds.  She is low risk for for PE.  Her laboratory work-up is unremarkable.  Troponin less than two, do not need to trend given chronicity of her chest pain symptoms.  She is mildly anemic with hemoglobin of 10.7, but denies any recent bleeding and it is improved when compared to her baseline of 8s.  She does have mild hypokalemia 3.2 which will replete with 20 mEq K-Dur.  Chest x-ray is without any acute cardiopulmonary disease and her EKG is unchanged when compared to EKG obtained while MAU.  She will need to establish with a OB/GYN for follow-up with her primary care provider for repeat laboratory work-up to ensure correction of her electrolyte derangement as well as ongoing evaluation management of her pregnancy.  While patient was reassured by her work-up today, she is frustrated with the length of time that she has had to spend in the ED and wished that we told her everything looked good yesterday.  We'll also place ambulatory referral to cardiology given her chest pain and recent syncopal episode in context of abnormal EKG.  ED return precautions discussed.  Patient voices understanding  and is agreeable to the plan.   Final Clinical Impression(s) / ED Diagnoses Final diagnoses:  Nonspecific chest pain    Rx / DC Orders ED Discharge Orders    None       Corena Herter, PA-C 10/26/20 1980    Lacretia Leigh, MD 10/28/20 1342

## 2020-10-26 NOTE — Discharge Instructions (Addendum)
I have placed an ambulatory referral to cardiology.  This means that they will call you to schedule an appointment for ongoing outpatient evaluation.  Your work-up today was reassuring.  However, given your syncopal episode in the context of chest discomfort, it is important that you follow-up with cardiology sooner rather than later.  You need to get established with a OB/GYN for ongoing evaluation and management of your pregnancy.  You would also benefit from establishing with a primary care provider if you do not already have one.  You will need to have your electrolytes rechecked given your mild low potassium here in the ED.  Please return to the ED or seek immediate medical attention should you experience any new or worsening symptoms.

## 2020-10-29 ENCOUNTER — Encounter: Payer: Self-pay | Admitting: General Practice

## 2020-12-04 ENCOUNTER — Encounter: Payer: Medicaid Other | Admitting: Obstetrics and Gynecology

## 2020-12-31 ENCOUNTER — Encounter: Payer: Medicaid Other | Admitting: Obstetrics and Gynecology

## 2021-01-21 ENCOUNTER — Encounter: Payer: Medicaid Other | Admitting: Obstetrics & Gynecology

## 2021-01-21 DIAGNOSIS — Z3490 Encounter for supervision of normal pregnancy, unspecified, unspecified trimester: Secondary | ICD-10-CM | POA: Insufficient documentation

## 2021-02-10 ENCOUNTER — Encounter: Payer: Medicaid Other | Admitting: Obstetrics & Gynecology

## 2022-01-02 IMAGING — CR DG CHEST 2V
2 series · 2 of 2 positions shown · non-contrast
Comparison: None.

CLINICAL DATA: Chest pain

EXAM:
CHEST - 2 VIEW

[chest pa]
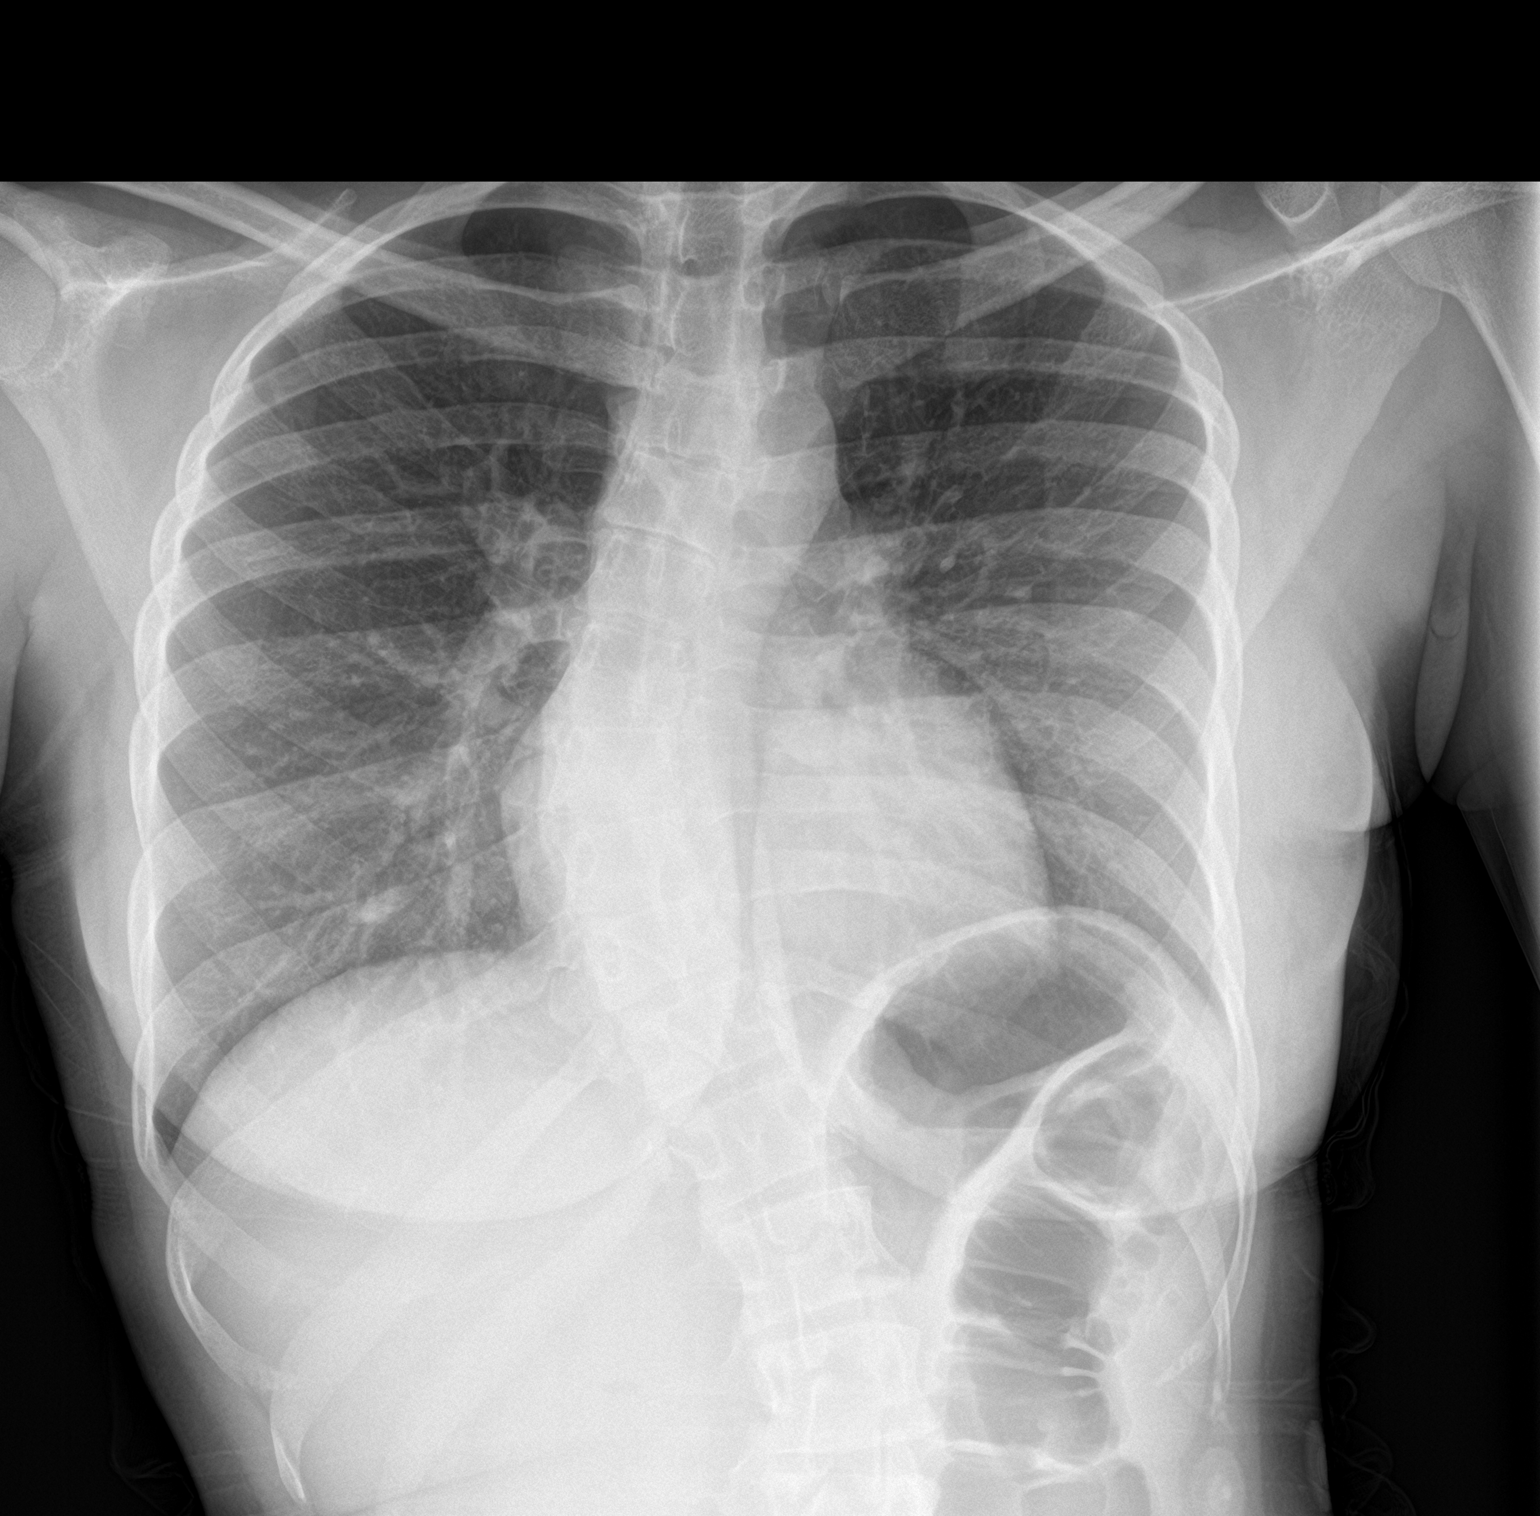

[chest lat]
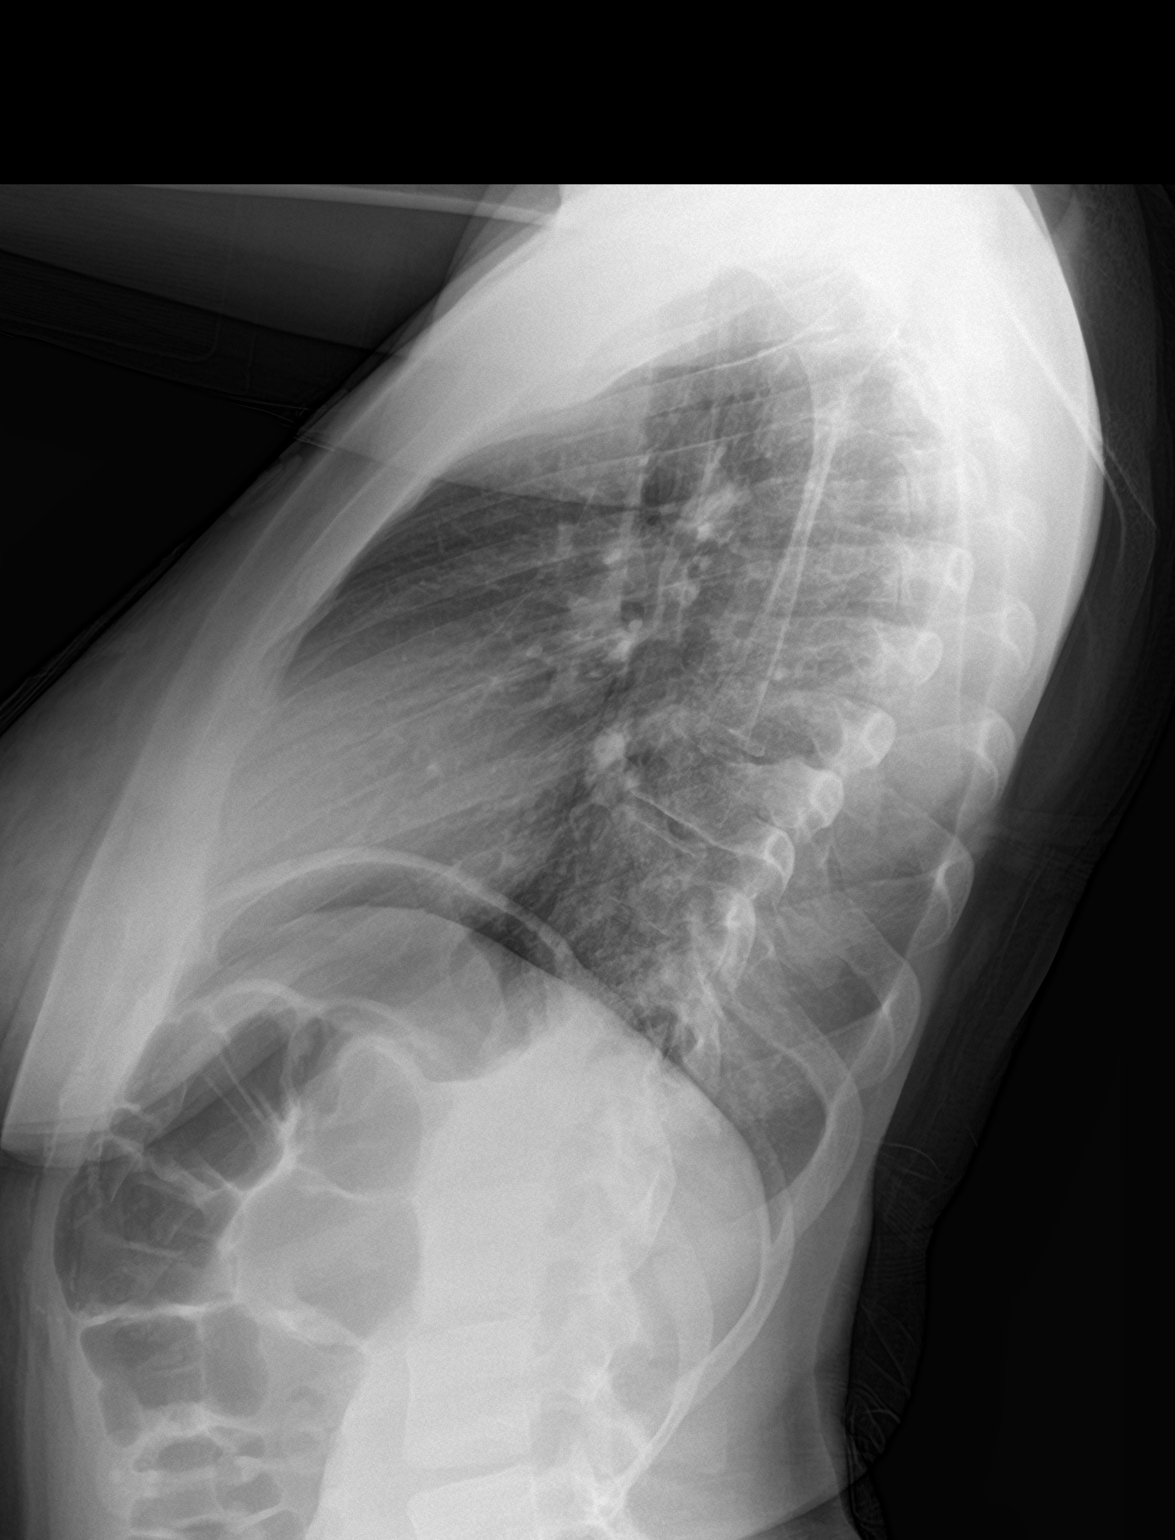

[2 of 2 positions shown; findings below may reference images not displayed]

FINDINGS: No consolidation, features of edema, pneumothorax, or effusion.
Pulmonary vascularity is normally distributed. The cardiomediastinal
contours are unremarkable. No acute osseous or soft tissue
abnormality. Dextrocurvature of the thoracic spine, apex T8 with
some likely compensatory curvature of the lumbar levels as well.
IMPRESSION: No acute cardiopulmonary abnormality.

Scoliotic curvature of the spine.
